# Patient Record
Sex: Female | Born: 1945 | Race: White | Hispanic: No | Marital: Married | Smoking: Former smoker
Health system: Southern US, Community
[De-identification: ages and names within clinical notes are randomized; demographics above are authoritative.]

## PROBLEM LIST (undated history)

## (undated) DIAGNOSIS — I10 Essential (primary) hypertension: Secondary | ICD-10-CM

## (undated) DIAGNOSIS — G459 Transient cerebral ischemic attack, unspecified: Secondary | ICD-10-CM

## (undated) DIAGNOSIS — C50919 Malignant neoplasm of unspecified site of unspecified female breast: Secondary | ICD-10-CM

## (undated) DIAGNOSIS — F419 Anxiety disorder, unspecified: Secondary | ICD-10-CM

## (undated) HISTORY — PX: AORTIC VALVE REPLACEMENT: SHX41

## (undated) HISTORY — PX: ROTATOR CUFF REPAIR: SHX139

## (undated) HISTORY — PX: APPENDECTOMY: SHX54

## (undated) HISTORY — PX: MASTECTOMY: SHX3

## (undated) HISTORY — DX: Transient cerebral ischemic attack, unspecified: G45.9

## (undated) HISTORY — DX: Anxiety disorder, unspecified: F41.9

## (undated) HISTORY — DX: Essential (primary) hypertension: I10

---

## 2007-01-16 ENCOUNTER — Encounter: Payer: Self-pay | Admitting: Orthopedic Surgery

## 2007-01-24 ENCOUNTER — Encounter: Payer: Self-pay | Admitting: Orthopedic Surgery

## 2007-02-23 ENCOUNTER — Encounter: Payer: Self-pay | Admitting: Orthopedic Surgery

## 2014-06-02 DIAGNOSIS — G8929 Other chronic pain: Secondary | ICD-10-CM | POA: Insufficient documentation

## 2014-06-02 DIAGNOSIS — M25559 Pain in unspecified hip: Secondary | ICD-10-CM

## 2014-11-26 ENCOUNTER — Other Ambulatory Visit (HOSPITAL_COMMUNITY): Payer: Self-pay | Admitting: Psychiatry

## 2014-11-26 DIAGNOSIS — R292 Abnormal reflex: Secondary | ICD-10-CM

## 2014-11-26 DIAGNOSIS — M542 Cervicalgia: Secondary | ICD-10-CM

## 2014-11-26 DIAGNOSIS — R202 Paresthesia of skin: Secondary | ICD-10-CM

## 2014-11-26 DIAGNOSIS — R200 Anesthesia of skin: Secondary | ICD-10-CM

## 2014-12-04 ENCOUNTER — Ambulatory Visit (HOSPITAL_COMMUNITY)
Admission: RE | Admit: 2014-12-04 | Discharge: 2014-12-04 | Disposition: A | Discharge status: Home / Self Care | Payer: Medicare Other | Source: Ambulatory Visit | Attending: Psychiatry | Admitting: Psychiatry

## 2014-12-04 DIAGNOSIS — M47892 Other spondylosis, cervical region: Secondary | ICD-10-CM | POA: Insufficient documentation

## 2014-12-04 DIAGNOSIS — R202 Paresthesia of skin: Secondary | ICD-10-CM

## 2014-12-04 DIAGNOSIS — R51 Headache: Secondary | ICD-10-CM | POA: Diagnosis not present

## 2014-12-04 DIAGNOSIS — M1288 Other specific arthropathies, not elsewhere classified, other specified site: Secondary | ICD-10-CM | POA: Insufficient documentation

## 2014-12-04 DIAGNOSIS — R200 Anesthesia of skin: Secondary | ICD-10-CM

## 2014-12-04 DIAGNOSIS — R292 Abnormal reflex: Secondary | ICD-10-CM

## 2014-12-04 DIAGNOSIS — M4802 Spinal stenosis, cervical region: Secondary | ICD-10-CM | POA: Insufficient documentation

## 2014-12-04 DIAGNOSIS — M542 Cervicalgia: Secondary | ICD-10-CM | POA: Diagnosis present

## 2015-03-10 ENCOUNTER — Other Ambulatory Visit (HOSPITAL_COMMUNITY): Payer: Self-pay | Admitting: Psychiatry

## 2015-03-10 ENCOUNTER — Ambulatory Visit (HOSPITAL_COMMUNITY)
Admission: RE | Admit: 2015-03-10 | Discharge: 2015-03-10 | Disposition: A | Discharge status: Home / Self Care | Payer: Medicare Other | Source: Ambulatory Visit | Attending: Psychiatry | Admitting: Psychiatry

## 2015-03-10 DIAGNOSIS — M5414 Radiculopathy, thoracic region: Secondary | ICD-10-CM | POA: Insufficient documentation

## 2015-03-10 DIAGNOSIS — M5410 Radiculopathy, site unspecified: Secondary | ICD-10-CM | POA: Insufficient documentation

## 2015-05-01 ENCOUNTER — Other Ambulatory Visit (HOSPITAL_COMMUNITY): Payer: Self-pay | Admitting: Psychiatry

## 2015-05-01 DIAGNOSIS — R269 Unspecified abnormalities of gait and mobility: Secondary | ICD-10-CM

## 2015-05-01 DIAGNOSIS — M546 Pain in thoracic spine: Secondary | ICD-10-CM

## 2015-05-12 ENCOUNTER — Ambulatory Visit (HOSPITAL_COMMUNITY)
Admission: RE | Admit: 2015-05-12 | Discharge: 2015-05-12 | Disposition: A | Discharge status: Home / Self Care | Payer: Medicare Other | Source: Ambulatory Visit | Attending: Psychiatry | Admitting: Psychiatry

## 2015-05-12 DIAGNOSIS — R918 Other nonspecific abnormal finding of lung field: Secondary | ICD-10-CM | POA: Insufficient documentation

## 2015-05-12 DIAGNOSIS — M545 Low back pain: Secondary | ICD-10-CM | POA: Diagnosis present

## 2015-05-12 DIAGNOSIS — Z853 Personal history of malignant neoplasm of breast: Secondary | ICD-10-CM | POA: Insufficient documentation

## 2015-05-12 DIAGNOSIS — R937 Abnormal findings on diagnostic imaging of other parts of musculoskeletal system: Secondary | ICD-10-CM | POA: Diagnosis not present

## 2015-05-12 DIAGNOSIS — M546 Pain in thoracic spine: Secondary | ICD-10-CM | POA: Diagnosis not present

## 2015-05-12 DIAGNOSIS — R269 Unspecified abnormalities of gait and mobility: Secondary | ICD-10-CM

## 2015-05-13 ENCOUNTER — Other Ambulatory Visit (HOSPITAL_COMMUNITY): Payer: Self-pay | Admitting: Psychiatry

## 2015-05-13 DIAGNOSIS — R222 Localized swelling, mass and lump, trunk: Secondary | ICD-10-CM

## 2015-05-19 ENCOUNTER — Ambulatory Visit (HOSPITAL_COMMUNITY)
Admission: RE | Admit: 2015-05-19 | Discharge: 2015-05-19 | Disposition: A | Discharge status: Home / Self Care | Payer: Medicare Other | Source: Ambulatory Visit | Attending: Psychiatry | Admitting: Psychiatry

## 2015-05-19 DIAGNOSIS — R222 Localized swelling, mass and lump, trunk: Secondary | ICD-10-CM | POA: Diagnosis present

## 2015-05-19 LAB — POCT I-STAT CREATININE: CREATININE: 0.7 mg/dL (ref 0.44–1.00)

## 2015-05-19 MED ORDER — IOHEXOL 300 MG/ML  SOLN
100.0000 mL | Freq: Once | INTRAMUSCULAR | Status: AC | PRN
  Administered 2015-05-19: 75 mL via INTRAVENOUS

## 2015-08-11 DIAGNOSIS — I350 Nonrheumatic aortic (valve) stenosis: Secondary | ICD-10-CM | POA: Insufficient documentation

## 2015-11-12 ENCOUNTER — Other Ambulatory Visit (HOSPITAL_COMMUNITY): Payer: Self-pay | Admitting: Family Medicine

## 2015-11-12 DIAGNOSIS — Z1231 Encounter for screening mammogram for malignant neoplasm of breast: Secondary | ICD-10-CM

## 2015-11-21 ENCOUNTER — Other Ambulatory Visit (HOSPITAL_COMMUNITY): Payer: Self-pay | Admitting: Family Medicine

## 2015-11-21 ENCOUNTER — Ambulatory Visit (HOSPITAL_COMMUNITY)
Admission: RE | Admit: 2015-11-21 | Discharge: 2015-11-21 | Disposition: A | Discharge status: Home / Self Care | Payer: Medicare Other | Source: Ambulatory Visit | Attending: Family Medicine | Admitting: Family Medicine

## 2015-11-21 DIAGNOSIS — Z1231 Encounter for screening mammogram for malignant neoplasm of breast: Secondary | ICD-10-CM | POA: Diagnosis not present

## 2015-12-17 ENCOUNTER — Ambulatory Visit (HOSPITAL_COMMUNITY)
Admission: RE | Admit: 2015-12-17 | Discharge: 2015-12-17 | Disposition: A | Discharge status: Home / Self Care | Payer: Medicare Other | Source: Ambulatory Visit | Attending: Psychiatry | Admitting: Psychiatry

## 2015-12-17 ENCOUNTER — Other Ambulatory Visit (HOSPITAL_COMMUNITY): Payer: Self-pay | Admitting: Psychiatry

## 2015-12-17 DIAGNOSIS — M545 Low back pain: Secondary | ICD-10-CM

## 2015-12-17 DIAGNOSIS — M549 Dorsalgia, unspecified: Secondary | ICD-10-CM | POA: Insufficient documentation

## 2016-07-21 ENCOUNTER — Other Ambulatory Visit
Admission: RE | Admit: 2016-07-21 | Discharge: 2016-07-21 | Disposition: A | Discharge status: Home / Self Care | Payer: Medicare Other | Source: Ambulatory Visit | Attending: Pain Medicine | Admitting: Pain Medicine

## 2016-07-21 ENCOUNTER — Ambulatory Visit
Admission: RE | Admit: 2016-07-21 | Discharge: 2016-07-21 | Disposition: A | Discharge status: Home / Self Care | Payer: Medicare Other | Source: Ambulatory Visit | Attending: Pain Medicine | Admitting: Pain Medicine

## 2016-07-21 ENCOUNTER — Ambulatory Visit: Payer: Medicare Other | Attending: Pain Medicine | Admitting: Pain Medicine

## 2016-07-21 ENCOUNTER — Encounter: Payer: Self-pay | Admitting: Pain Medicine

## 2016-07-21 VITALS — BP 156/77 | HR 101 | Temp 98.3°F | Resp 18 | Ht <= 58 in | Wt 159.0 lb

## 2016-07-21 DIAGNOSIS — I700 Atherosclerosis of aorta: Secondary | ICD-10-CM | POA: Insufficient documentation

## 2016-07-21 DIAGNOSIS — S32010S Wedge compression fracture of first lumbar vertebra, sequela: Secondary | ICD-10-CM

## 2016-07-21 DIAGNOSIS — G8929 Other chronic pain: Secondary | ICD-10-CM | POA: Insufficient documentation

## 2016-07-21 DIAGNOSIS — S22050A Wedge compression fracture of T5-T6 vertebra, initial encounter for closed fracture: Secondary | ICD-10-CM | POA: Insufficient documentation

## 2016-07-21 DIAGNOSIS — M542 Cervicalgia: Secondary | ICD-10-CM

## 2016-07-21 DIAGNOSIS — S32040S Wedge compression fracture of fourth lumbar vertebra, sequela: Secondary | ICD-10-CM

## 2016-07-21 DIAGNOSIS — M546 Pain in thoracic spine: Secondary | ICD-10-CM

## 2016-07-21 DIAGNOSIS — Z8781 Personal history of (healed) traumatic fracture: Secondary | ICD-10-CM | POA: Insufficient documentation

## 2016-07-21 DIAGNOSIS — M545 Low back pain, unspecified: Secondary | ICD-10-CM | POA: Insufficient documentation

## 2016-07-21 DIAGNOSIS — S3992XA Unspecified injury of lower back, initial encounter: Secondary | ICD-10-CM | POA: Diagnosis not present

## 2016-07-21 DIAGNOSIS — M47814 Spondylosis without myelopathy or radiculopathy, thoracic region: Secondary | ICD-10-CM

## 2016-07-21 DIAGNOSIS — S32050A Wedge compression fracture of fifth lumbar vertebra, initial encounter for closed fracture: Secondary | ICD-10-CM | POA: Insufficient documentation

## 2016-07-21 DIAGNOSIS — M818 Other osteoporosis without current pathological fracture: Secondary | ICD-10-CM | POA: Diagnosis not present

## 2016-07-21 DIAGNOSIS — M5414 Radiculopathy, thoracic region: Secondary | ICD-10-CM

## 2016-07-21 DIAGNOSIS — M810 Age-related osteoporosis without current pathological fracture: Secondary | ICD-10-CM

## 2016-07-21 DIAGNOSIS — M47894 Other spondylosis, thoracic region: Secondary | ICD-10-CM | POA: Insufficient documentation

## 2016-07-21 DIAGNOSIS — F1190 Opioid use, unspecified, uncomplicated: Secondary | ICD-10-CM | POA: Diagnosis not present

## 2016-07-21 DIAGNOSIS — S22000S Wedge compression fracture of unspecified thoracic vertebra, sequela: Secondary | ICD-10-CM

## 2016-07-21 DIAGNOSIS — M4854XA Collapsed vertebra, not elsewhere classified, thoracic region, initial encounter for fracture: Secondary | ICD-10-CM | POA: Insufficient documentation

## 2016-07-21 DIAGNOSIS — S32040A Wedge compression fracture of fourth lumbar vertebra, initial encounter for closed fracture: Secondary | ICD-10-CM | POA: Insufficient documentation

## 2016-07-21 DIAGNOSIS — S22000A Wedge compression fracture of unspecified thoracic vertebra, initial encounter for closed fracture: Secondary | ICD-10-CM | POA: Insufficient documentation

## 2016-07-21 DIAGNOSIS — Z952 Presence of prosthetic heart valve: Secondary | ICD-10-CM | POA: Insufficient documentation

## 2016-07-21 DIAGNOSIS — M47896 Other spondylosis, lumbar region: Secondary | ICD-10-CM

## 2016-07-21 DIAGNOSIS — Z79891 Long term (current) use of opiate analgesic: Secondary | ICD-10-CM | POA: Insufficient documentation

## 2016-07-21 DIAGNOSIS — S22050S Wedge compression fracture of T5-T6 vertebra, sequela: Secondary | ICD-10-CM

## 2016-07-21 DIAGNOSIS — M47816 Spondylosis without myelopathy or radiculopathy, lumbar region: Secondary | ICD-10-CM | POA: Insufficient documentation

## 2016-07-21 DIAGNOSIS — Z0189 Encounter for other specified special examinations: Secondary | ICD-10-CM | POA: Insufficient documentation

## 2016-07-21 DIAGNOSIS — Z79899 Other long term (current) drug therapy: Secondary | ICD-10-CM | POA: Diagnosis not present

## 2016-07-21 DIAGNOSIS — M79604 Pain in right leg: Secondary | ICD-10-CM | POA: Diagnosis not present

## 2016-07-21 DIAGNOSIS — S32050S Wedge compression fracture of fifth lumbar vertebra, sequela: Secondary | ICD-10-CM

## 2016-07-21 DIAGNOSIS — M47812 Spondylosis without myelopathy or radiculopathy, cervical region: Secondary | ICD-10-CM | POA: Insufficient documentation

## 2016-07-21 DIAGNOSIS — Z5181 Encounter for therapeutic drug level monitoring: Secondary | ICD-10-CM

## 2016-07-21 DIAGNOSIS — M4806 Spinal stenosis, lumbar region: Secondary | ICD-10-CM | POA: Diagnosis not present

## 2016-07-21 DIAGNOSIS — M4724 Other spondylosis with radiculopathy, thoracic region: Secondary | ICD-10-CM | POA: Diagnosis not present

## 2016-07-21 DIAGNOSIS — S32010A Wedge compression fracture of first lumbar vertebra, initial encounter for closed fracture: Secondary | ICD-10-CM | POA: Insufficient documentation

## 2016-07-21 DIAGNOSIS — M4802 Spinal stenosis, cervical region: Secondary | ICD-10-CM

## 2016-07-21 DIAGNOSIS — M549 Dorsalgia, unspecified: Secondary | ICD-10-CM

## 2016-07-21 LAB — SEDIMENTATION RATE: Sed Rate: 55 mm/hr — ABNORMAL HIGH (ref 0–30)

## 2016-07-21 LAB — COMPREHENSIVE METABOLIC PANEL
ALK PHOS: 81 U/L (ref 38–126)
ALT: 22 U/L (ref 14–54)
AST: 23 U/L (ref 15–41)
Albumin: 3.9 g/dL (ref 3.5–5.0)
Anion gap: 7 (ref 5–15)
BILIRUBIN TOTAL: 0.9 mg/dL (ref 0.3–1.2)
BUN: 12 mg/dL (ref 6–20)
CALCIUM: 9 mg/dL (ref 8.9–10.3)
CHLORIDE: 99 mmol/L — AB (ref 101–111)
CO2: 26 mmol/L (ref 22–32)
CREATININE: 0.75 mg/dL (ref 0.44–1.00)
GFR calc non Af Amer: >60 mL/min (ref >60)
Glucose, Bld: 103 mg/dL — ABNORMAL HIGH (ref 65–99)
Potassium: 4.1 mmol/L (ref 3.5–5.1)
Sodium: 132 mmol/L — ABNORMAL LOW (ref 135–145)
TOTAL PROTEIN: 7.6 g/dL (ref 6.5–8.1)

## 2016-07-21 LAB — MAGNESIUM: Magnesium: 1.8 mg/dL (ref 1.7–2.4)

## 2016-07-21 LAB — C-REACTIVE PROTEIN: CRP: 2.6 mg/dL — AB (ref <1.0)

## 2016-07-21 LAB — VITAMIN B12: VITAMIN B 12: 1329 pg/mL — AB (ref 180–914)

## 2016-07-21 MED ORDER — KETOROLAC TROMETHAMINE 60 MG/2ML IM SOLN
INTRAMUSCULAR | Status: AC
  Administered 2016-07-21: 16:00:00 via INTRAMUSCULAR
  Filled 2016-07-21: qty 2

## 2016-07-21 MED ORDER — KETOROLAC TROMETHAMINE 60 MG/2ML IM SOLN: 60.0000 mg | Freq: Once | INTRAMUSCULAR | Status: AC

## 2016-07-21 MED ORDER — ORPHENADRINE CITRATE 30 MG/ML IJ SOLN: 60.0000 mg | Freq: Once | INTRAMUSCULAR | Status: AC

## 2016-07-21 MED ORDER — ORPHENADRINE CITRATE 30 MG/ML IJ SOLN
INTRAMUSCULAR | Status: AC
  Administered 2016-07-21: 60 mg via INTRAMUSCULAR
  Filled 2016-07-21: qty 2

## 2016-07-21 NOTE — Progress Notes (Signed)
Safety precautions to be maintained throughout the outpatient stay will include: orient to surroundings, keep bed in low position, maintain call bell within reach at all times, provide assistance with transfer out of bed and ambulation.  

## 2016-07-21 NOTE — Patient Instructions (Signed)
Epidural Steroid Injection Patient Information  Description: The epidural space surrounds the nerves as they exit the spinal cord.  In some patients, the nerves can be compressed and inflamed by a bulging disc or a tight spinal canal (spinal stenosis).  By injecting steroids into the epidural space, we can bring irritated nerves into direct contact with a potentially helpful medication.  These steroids act directly on the irritated nerves and can reduce swelling and inflammation which often leads to decreased pain.  Epidural steroids may be injected anywhere along the spine and from the neck to the low back depending upon the location of your pain.   After numbing the skin with local anesthetic (like Novocaine), a small needle is passed into the epidural space slowly.  You may experience a sensation of pressure while this is being done.  The entire block usually last less than 10 minutes.  Conditions which may be treated by epidural steroids:   Low back and leg pain  Neck and arm pain  Spinal stenosis  Post-laminectomy syndrome  Herpes zoster (shingles) pain  Pain from compression fractures  Preparation for the injection:  1. Do not eat any solid food or dairy products within 8 hours of your appointment.  2. You may drink clear liquids up to 3 hours before appointment.  Clear liquids include water, black coffee, juice or soda.  No milk or cream please. 3. You may take your regular medication, including pain medications, with a sip of water before your appointment  Diabetics should hold regular insulin (if taken separately) and take 1/2 normal NPH dos the morning of the procedure.  Carry some sugar containing items with you to your appointment. 4. A driver must accompany you and be prepared to drive you home after your procedure.  5. Bring all your current medications with your. 6. An IV may be inserted and sedation may be given at the discretion of the physician.   7. A blood pressure  cuff, EKG and other monitors will often be applied during the procedure.  Some patients may need to have extra oxygen administered for a short period. 8. You will be asked to provide medical information, including your allergies, prior to the procedure.  We must know immediately if you are taking blood thinners (like Coumadin/Warfarin)  Or if you are allergic to IV iodine contrast (dye). We must know if you could possible be pregnant.  Possible side-effects:  Bleeding from needle site  Infection (rare, may require surgery)  Nerve injury (rare)  Numbness & tingling (temporary)  Difficulty urinating (rare, temporary)  Spinal headache ( a headache worse with upright posture)  Light -headedness (temporary)  Pain at injection site (several days)  Decreased blood pressure (temporary)  Weakness in arm/leg (temporary)  Pressure sensation in back/neck (temporary)  Call if you experience:  Fever/chills associated with headache or increased back/neck pain.  Headache worsened by an upright position.  New onset weakness or numbness of an extremity below the injection site  Hives or difficulty breathing (go to the emergency room)  Inflammation or drainage at the infection site  Severe back/neck pain  Any new symptoms which are concerning to you  Please note:  Although the local anesthetic injected can often make your back or neck feel good for several hours after the injection, the pain will likely return.  It takes 3-7 days for steroids to work in the epidural space.  You may not notice any pain relief for at least that one week.    If effective, we will often do a series of three injections spaced 3-6 weeks apart to maximally decrease your pain.  After the initial series, we generally will wait several months before considering a repeat injection of the same type.  If you have any questions, please call [phone] [place] your labs and  x-rays done as soon as possible.

## 2016-07-21 NOTE — Progress Notes (Signed)
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 strength & Tone: Functionally intact  Muscle strength & Tone: Functionally intact  Sensory: Unimpaired  Sensory: Unimpaired  Palpation: Non-contributory  Palpation: Non-contributory    Assessment  Primary Diagnosis & Pertinent Problem List: The primary encounter diagnosis was Chronic pain. Diagnoses of Long term current use of opiate analgesic, Long term prescription opiate use, Opiate use, Encounter for therapeutic drug level monitoring, Encounter for pain management planning, Chronic neck pain (Left), Chronic upper back pain (Bilateral), Chronic low back pain (Bilateral) (R>L), Chronic lower extremity pain (Right), History of compression fracture of vertebral column (50% L1, L4), Lumbar facet hypertrophy, Lumbar facet syndrome, Osteoporosis, Compression fracture of thoracic vertebra, sequela, Compression fracture of L1 lumbar vertebra, sequela, Compression fracture of L4 lumbar vertebra, sequela, Compression  fracture of L5 lumbar vertebra, sequela, Closed wedge compression fracture of T6 vertebra, sequela, Cervical foraminal stenosis, Cervical spondylosis, Thoracic spondylosis, Lumbar spondylosis, unspecified spinal osteoarthritis, Acute low back pain, and Thoracic radiculopathy (Location of Primary Source of Pain) (Bilateral) (T6) were also pertinent to this visit.  Visit Diagnosis: 1. Chronic pain   2. Long term current use of opiate analgesic   3. Long term prescription opiate use   4. Opiate use   5. Encounter for therapeutic drug level monitoring   6. Encounter for pain management planning   7. Chronic neck pain (Left)   8. Chronic upper back pain (Bilateral)   9. Chronic low back pain (Bilateral) (R>L)   10. Chronic lower extremity pain (Right)   11. History of compression fracture of vertebral column (50% L1, L4)   12. Lumbar facet hypertrophy   13. Lumbar facet syndrome   14. Osteoporosis   15. Compression fracture of thoracic vertebra, sequela   16. Compression fracture of L1 lumbar vertebra, sequela   17. Compression fracture of L4 lumbar vertebra, sequela   18. Compression fracture of L5 lumbar vertebra, sequela   19. Closed wedge compression fracture of T6 vertebra, sequela   20. Cervical foraminal stenosis   21. Cervical spondylosis   22. Thoracic spondylosis   23. Lumbar spondylosis, unspecified spinal osteoarthritis   24. Acute low back pain   25. Thoracic radiculopathy (Location of Primary Source of Pain) (Bilateral) (T6)    Plan of Care  Initial Treatment Plan:  Please be advised that as  per protocol, today's visit has been an evaluation only. We have not taken over the patient's controlled substance management.  Problem-Specific Plan: No problem-specific Assessment & Plan notes found for this encounter.  Ordered Lab-work, Procedure(s), & Referral(s) : Orders Placed This Encounter  Procedures  . Thoracic Epidural Injection  . DG Thoracic Spine 2 View  .  Compliance Drug Analysis, Ur  . Comprehensive metabolic panel  . C-reactive protein  . Magnesium  . Sedimentation rate  . Vitamin B12  . 25-Hydroxyvitamin D Lcms D2+D3  . Ambulatory referral to Psychology  . Ambulatory referral to Neurosurgery  Referral(s) or Consult(s): Medical psychology consult for substance use disorder evaluation Pharmacotherapy: Medications ordered:  Meds ordered this encounter  Medications  . orphenadrine (NORFLEX) injection 60 mg  . ketorolac (TORADOL) injection 60 mg  . orphenadrine (NORFLEX) 30 MG/ML injection    Donneta Romberg, Dena: cabinet override  . ketorolac (TORADOL) 60 MG/2ML injection    Donneta Romberg, Dena: cabinet override   Prescriptions ordered during this visit: New Prescriptions   No medications on file   Medications administered during this visit: We administered orphenadrine and ketorolac.   Pharmacotherapy plan under consideration:  The patient is currently taking tramadol 50 mg 1 tablet by mouth 3 times a day. She clearly has indications for the use of controlled substances. We may consider going up on her dose to 2 tablets by mouth 4 times a day PRN.    Interventional Therapies: Interventional procedures under consideration:  Left T5-6 thoracic epidural steroid injection under fluoroscopic guidance and IV sedation. Referral to neurosurgery for possible T6 kyphoplasty with biopsy.    Requested PM Follow-up: Return for (ASAP) for Scheduled Procedure.  Future Appointments Date Time Provider [place]  07/21/2016 4:30 PM [facility]  07/27/2016 9:15 AM Milinda Pointer, MD [facility] None    Primary Care Physician: Petra Kuba, MD Location: [facility] Note by: Kathlen Brunswick. Dossie Arbour, M.D, DABA, DABAPM, DABPM, DABIPP, FIPP  Pain Score Disclaimer: We use the NRS-11 scale. This is a self-reported, subjective measurement of pain severity with only modest accuracy. It is used primarily to  identify changes within a particular patient. It must be understood that outpatient pain scales are significantly less accurate that those used for research, where they can be applied under ideal controlled circumstances with minimal exposure to variables. In reality, the score is likely to be a combination of pain intensity and pain affect, where pain affect describes the degree of emotional arousal or changes in action readiness caused by the sensory experience of pain. Factors such as social and work situation, setting, emotional state, anxiety levels, expectation, and prior pain experience may influence pain perception and show large inter-individual differences that may also be affected by time variables.  Patient instructions provided during this appointment: Patient Instructions  Epidural Steroid Injection Patient Information  Description: The epidural space surrounds the nerves as they exit the spinal cord.  In some patients, the nerves can be compressed and inflamed by a bulging disc or a tight spinal canal (spinal stenosis).  By injecting steroids into the epidural space, we can bring irritated nerves into direct contact with a potentially helpful medication.  These steroids act directly on the irritated nerves and can reduce swelling and inflammation which often leads to decreased pain.  Epidural steroids may be injected anywhere along the spine and from the neck to the low back depending upon the location of your pain.   After numbing the skin with local  anesthetic (like Novocaine), a small needle is passed into the epidural space slowly.  You may experience a sensation of pressure while this is being done.  The entire block usually last less than 10 minutes.  Conditions which may be treated by epidural steroids:   Low back and leg pain  Neck and arm pain  Spinal stenosis  Post-laminectomy syndrome  Herpes zoster (shingles) pain  Pain from compression fractures  Preparation for  the injection:  1. Do not eat any solid food or dairy products within 8 hours of your appointment.  2. You may drink clear liquids up to 3 hours before appointment.  Clear liquids include water, black coffee, juice or soda.  No milk or cream please. 3. You may take your regular medication, including pain medications, with a sip of water before your appointment  Diabetics should hold regular insulin (if taken separately) and take 1/2 normal NPH dos the morning of the procedure.  Carry some sugar containing items with you to your appointment. 4. A driver must accompany you and be prepared to drive you home after your procedure.  5. Bring all your current medications with your. 6. An IV may be inserted and sedation may be given at the discretion of the physician.   7. A blood pressure cuff, EKG and other monitors will often be applied during the procedure.  Some patients may need to have extra oxygen administered for a short period. 8. You will be asked to provide medical information, including your allergies, prior to the procedure.  We must know immediately if you are taking blood thinners (like Coumadin/Warfarin)  Or if you are allergic to IV iodine contrast (dye). We must know if you could possible be pregnant.  Possible side-effects:  Bleeding from needle site  Infection (rare, may require surgery)  Nerve injury (rare)  Numbness & tingling (temporary)  Difficulty urinating (rare, temporary)  Spinal headache ( a headache worse with upright posture)  Light -headedness (temporary)  Pain at injection site (several days)  Decreased blood pressure (temporary)  Weakness in arm/leg (temporary)  Pressure sensation in back/neck (temporary)  Call if you experience:  Fever/chills associated with headache or increased back/neck pain.  Headache worsened by an upright position.  New onset weakness or numbness of an extremity below the injection site  Hives or difficulty breathing (go to  the emergency room)  Inflammation or drainage at the infection site  Severe back/neck pain  Any new symptoms which are concerning to you  Please note:  Although the local anesthetic injected can often make your back or neck feel good for several hours after the injection, the pain will likely return.  It takes 3-7 days for steroids to work in the epidural space.  You may not notice any pain relief for at least that one week.  If effective, we will often do a series of three injections spaced 3-6 weeks apart to maximally decrease your pain.  After the initial series, we generally will wait several months before considering a repeat injection of the same type.  If you have any questions, please call [phone] [place] your labs and x-rays done as soon as possible.

## 2016-07-25 LAB — 25-HYDROXY VITAMIN D LCMS D2+D3
25-Hydroxy, Vitamin D-2: <1 ng/mL
25-Hydroxy, Vitamin D-3: 40 ng/mL

## 2016-07-25 LAB — 25-HYDROXYVITAMIN D LCMS D2+D3: 25-HYDROXY, VITAMIN D: 40 ng/mL

## 2016-07-27 ENCOUNTER — Ambulatory Visit (HOSPITAL_BASED_OUTPATIENT_CLINIC_OR_DEPARTMENT_OTHER): Payer: Medicare Other | Admitting: Pain Medicine

## 2016-07-27 ENCOUNTER — Encounter: Payer: Self-pay | Admitting: Pain Medicine

## 2016-07-27 ENCOUNTER — Ambulatory Visit
Admission: RE | Admit: 2016-07-27 | Discharge: 2016-07-27 | Disposition: A | Discharge status: Home / Self Care | Payer: Medicare Other | Source: Ambulatory Visit | Attending: Pain Medicine | Admitting: Pain Medicine

## 2016-07-27 VITALS — BP 138/80 | HR 71 | Temp 97.0°F | Resp 30 | Ht <= 58 in | Wt 160.0 lb

## 2016-07-27 DIAGNOSIS — S22050A Wedge compression fracture of T5-T6 vertebra, initial encounter for closed fracture: Secondary | ICD-10-CM | POA: Diagnosis not present

## 2016-07-27 DIAGNOSIS — M4724 Other spondylosis with radiculopathy, thoracic region: Secondary | ICD-10-CM | POA: Diagnosis not present

## 2016-07-27 DIAGNOSIS — G8929 Other chronic pain: Secondary | ICD-10-CM | POA: Insufficient documentation

## 2016-07-27 DIAGNOSIS — Z952 Presence of prosthetic heart valve: Secondary | ICD-10-CM | POA: Diagnosis not present

## 2016-07-27 DIAGNOSIS — I350 Nonrheumatic aortic (valve) stenosis: Secondary | ICD-10-CM | POA: Insufficient documentation

## 2016-07-27 DIAGNOSIS — S22000S Wedge compression fracture of unspecified thoracic vertebra, sequela: Secondary | ICD-10-CM | POA: Diagnosis not present

## 2016-07-27 DIAGNOSIS — M4856XA Collapsed vertebra, not elsewhere classified, lumbar region, initial encounter for fracture: Secondary | ICD-10-CM | POA: Diagnosis not present

## 2016-07-27 DIAGNOSIS — M545 Low back pain: Secondary | ICD-10-CM | POA: Insufficient documentation

## 2016-07-27 DIAGNOSIS — M5414 Radiculopathy, thoracic region: Secondary | ICD-10-CM | POA: Insufficient documentation

## 2016-07-27 DIAGNOSIS — M47894 Other spondylosis, thoracic region: Secondary | ICD-10-CM | POA: Diagnosis not present

## 2016-07-27 DIAGNOSIS — M542 Cervicalgia: Secondary | ICD-10-CM | POA: Insufficient documentation

## 2016-07-27 MED ORDER — ROPIVACAINE HCL 2 MG/ML IJ SOLN
2.0000 mL | Freq: Once | INTRAMUSCULAR | Status: AC
  Administered 2016-07-27: 2 mL via EPIDURAL

## 2016-07-27 MED ORDER — SODIUM CHLORIDE 0.9% FLUSH
2.0000 mL | Freq: Once | INTRAVENOUS | Status: AC
  Administered 2016-07-27: 2 mL

## 2016-07-27 MED ORDER — CEFAZOLIN SODIUM 1 G IJ SOLR
INTRAMUSCULAR | Status: AC
Start: 2016-07-27 — End: 2016-07-27
  Administered 2016-07-27: 1 g via INTRAVENOUS
  Filled 2016-07-27: qty 10

## 2016-07-27 MED ORDER — FENTANYL CITRATE (PF) 100 MCG/2ML IJ SOLN
INTRAMUSCULAR | Status: AC
  Administered 2016-07-27: 50 ug via INTRAVENOUS
  Filled 2016-07-27: qty 2

## 2016-07-27 MED ORDER — CEFAZOLIN (ANCEF) 1 G IV SOLR
1.0000 g | INTRAVENOUS | Status: AC
Start: 2016-07-27 — End: 2016-07-28
  Filled 2016-07-27: qty 1

## 2016-07-27 MED ORDER — MIDAZOLAM HCL 5 MG/5ML IJ SOLN
INTRAMUSCULAR | Status: AC
  Administered 2016-07-27: 2 mg via INTRAVENOUS
  Filled 2016-07-27: qty 5

## 2016-07-27 MED ORDER — SODIUM CHLORIDE 0.9 % IJ SOLN
INTRAMUSCULAR | Status: AC
  Filled 2016-07-27: qty 10

## 2016-07-27 MED ORDER — LACTATED RINGERS IV SOLN: 1000.0000 mL | Freq: Once | INTRAVENOUS | Status: AC

## 2016-07-27 MED ORDER — FENTANYL CITRATE (PF) 100 MCG/2ML IJ SOLN: 25.0000 ug | INTRAMUSCULAR | Status: AC | PRN

## 2016-07-27 MED ORDER — LIDOCAINE HCL (PF) 1 % IJ SOLN
INTRAMUSCULAR | Status: AC
  Administered 2016-07-27: 10:00:00
  Filled 2016-07-27: qty 5

## 2016-07-27 MED ORDER — LIDOCAINE HCL (PF) 1 % IJ SOLN
10.0000 mL | Freq: Once | INTRAMUSCULAR | Status: AC
  Administered 2016-07-27: 10 mL

## 2016-07-27 MED ORDER — IOPAMIDOL (ISOVUE-M 200) INJECTION 41%
INTRAMUSCULAR | Status: AC
  Administered 2016-07-27: 10:00:00
  Filled 2016-07-27: qty 10

## 2016-07-27 MED ORDER — DEXAMETHASONE SODIUM PHOSPHATE 10 MG/ML IJ SOLN
INTRAMUSCULAR | Status: AC
Start: 2016-07-27 — End: 2016-07-27
  Administered 2016-07-27: 10:00:00
  Filled 2016-07-27: qty 1

## 2016-07-27 MED ORDER — MIDAZOLAM HCL 5 MG/5ML IJ SOLN: 1.0000 mg | INTRAMUSCULAR | Status: AC | PRN

## 2016-07-27 MED ORDER — ROPIVACAINE HCL 2 MG/ML IJ SOLN
INTRAMUSCULAR | Status: AC
Start: 2016-07-27 — End: 2016-07-27
  Administered 2016-07-27: 10:00:00
  Filled 2016-07-27: qty 10

## 2016-07-27 MED ORDER — IOPAMIDOL (ISOVUE-M 200) INJECTION 41%: 10.0000 mL | Freq: Once | INTRAMUSCULAR | Status: AC

## 2016-07-27 MED ORDER — DEXAMETHASONE SODIUM PHOSPHATE 10 MG/ML IJ SOLN: 10.0000 mg | Freq: Once | INTRAMUSCULAR | Status: AC

## 2016-07-27 NOTE — Progress Notes (Signed)
Patient's Name: Beth Sloan  MRN: [id number]  Referring Provider: Petra Kuba, MD  DOB: 1946/05/11  PCP: Petra Kuba, MD  DOS: 07/27/2016  Note by: Kathlen Brunswick. Dossie Arbour, MD  Service setting: Ambulatory outpatient  Location: [facility] (AMB) Pain Management Facility  Visit type: Procedure  Specialty: Interventional Pain Management  Patient type: Established   Primary Reason for Visit: Interventional Pain Management Treatment. CC: Back Pain (upper and lower )  Procedure:  Anesthesia, Analgesia, Anxiolysis:  Type: Therapeutic Inter-Laminar Thoracic Epidural Block Region: Posterior Thoracolumbar Level: T5-6 Laterality: Left Paraspinal    Type: Moderate (Conscious) Sedation & Local Anesthesia Local Anesthetic: Lidocaine 1% Route: Intravenous (IV) IV Access: Secured Sedation: Meaningful verbal contact was maintained at all times during the procedure  Indication(s): Analgesia & Anxiolysis  Indications: 1. Thoracic radiculopathy (Location of Primary Source of Pain) (Bilateral) (T6)   2. Osteoarthritis of spine with radiculopathy, thoracic region   3. Closed compression fracture of thoracic vertebra, sequela   4. S/P aortic valve replacement    Pain Score: Pre-procedure: 9 /10 Post-procedure: 7 /10  Pre-Procedure Assessment:  Ms. Rowen is a 70 y.o. year old, female patient, seen today for interventional treatment. She has Chronic hip pain; S/P aortic valve replacement; Severe aortic stenosis; Chronic pain; Long term current use of opiate analgesic; Long term prescription opiate use; Opiate use; Encounter for therapeutic drug level monitoring; Encounter for pain management planning; Chronic neck pain (Left); Chronic upper back pain (Bilateral); Chronic low back pain (Bilateral) (R>L); Chronic lower extremity pain (Right); History of compression fracture of vertebral column (L1, L4) (50% L5); Lumbar facet hypertrophy; Lumbar facet syndrome; Osteoporosis; Compression fracture of  thoracic vertebra (HCC) (T6); Compression fracture of L1 lumbar vertebra ([place]); Compression fracture of L4 lumbar vertebra ([place]); Compression fracture of L5 lumbar vertebra ([place]); Closed wedge compression fracture of T6 vertebra ([place]); Cervical foraminal stenosis; Cervical spondylosis; Thoracic spondylosis; Lumbar spondylosis; Acute low back pain; and Thoracic radiculopathy (Location of Primary Source of Pain) (Bilateral) (T6) on her problem list.. Her primarily concern today is the Back Pain (upper and lower )   Pain Type: Chronic pain Pain Location: Back Pain Orientation: Upper, Lower Pain Descriptors / Indicators: Burning Pain Frequency: Constant  Date of Last Visit: 07/21/16 Service Provided on Last Visit: Evaluation (new pt)  Coagulation Parameters No results found for: INR, LABPROT, APTT, PLT  Verification of the correct person, correct site (including marking of site), and correct procedure were performed and confirmed by the patient.  Consent: Secured. Under the influence of no sedatives a written informed consent was obtained, after having provided information on the risks and possible complications. To fulfill our ethical and legal obligations, as recommended by the American Medical Association's Code of Ethics, we have provided information to the patient about our clinical impression; the nature and purpose of the treatment or procedure; the risks, benefits, and possible complications of the intervention; alternatives; the risk(s) and benefit(s) of the alternative treatment(s) or procedure(s); and the risk(s) and benefit(s) of doing nothing. The patient was provided information about the risks and possible complications associated with the procedure. These include, but are not limited to, failure to achieve desired goals, infection, bleeding, organ or nerve damage, allergic reactions, paralysis, and death. In the case of spinal procedures these may include, but are not limited to,  failure to achieve desired goals, infection, bleeding, organ or nerve damage, allergic reactions, paralysis, and death. In addition, the patient was informed that Medicine is not an exact science; therefore, there is also  the possibility of unforeseen risks and possible complications that may result in a catastrophic outcome. The patient indicated having understood very clearly. We have given the patient no guarantees and we have made no promises. Enough time was given to the patient to ask questions, all of which were answered to the patient's satisfaction.  Consent Attestation: I, the ordering provider, attest that I have discussed with the patient the benefits, risks, side-effects, alternatives, likelihood of achieving goals, and potential problems during recovery for the procedure that I have provided informed consent.  Pre-Procedure Preparation: Safety Precautions: Allergies reviewed. Appropriate site, procedure, and patient were confirmed by following the Joint Commission's Universal Protocol (UP.01.01.01), in the form of a "Time Out". The patient was asked to confirm marked site and procedure, before commencing. The patient was asked about blood thinners, or active infections, both of which were denied. Patient was assessed for positional comfort and all pressure points were checked before starting procedure. Allergies: She has no allergies on file.. Infection Control Precautions: Sterile technique used. Standard Universal Precautions were taken as recommended by the Department of [facility] for Disease Control and Prevention (CDC). Standard pre-surgical skin prep was conducted. Respiratory hygiene and cough etiquette was practiced. Hand hygiene observed. Safe injection practices and needle disposal techniques followed. SDV (single dose vial) medications used. Medications properly checked for expiration dates and contaminants. Personal protective equipment (PPE) used: Surgical mask. Sterile  Radiation-resistant gloves. Monitoring:  As per clinic protocol. Vitals:   07/27/16 1029 07/27/16 1036 07/27/16 1047 07/27/16 1056  BP: 131/71 (!) 146/84 132/75 138/80  Pulse: 77 76 72 71  Resp: 15 17 (!) 22 (!) 30  Temp:  97.7 F (36.5 C)  97 F (36.1 C)  TempSrc:  Temporal    SpO2: 94% 96% 95% 95%  Weight:      Height:      Calculated BMI: Body mass index is 35.87 kg/m.  Description of Procedure Process:  Time-out: "Time-out" completed before starting procedure, as per protocol. Position: Prone Target Area: For Epidural Steroid injection(s), the target area is the  interlaminar space, initially targeting the lower border of the superior vertebral body lamina. Approach: Interlaminar approach. Area Prepped: Entire Posterior Thoracolumbar Region Prepping solution: Duraprep (Iodine Povacrylex [0.7% available Iodine] and Isopropyl Alcohol, 74% w/w) Safety Precautions: Aspiration looking for blood return was conducted prior to all injections. At no point did we inject any substances, as a needle was being advanced. No attempts were made at seeking any paresthesias. Safe injection practices and needle disposal techniques used. Medications properly checked for expiration dates. SDV (single dose vial) medications used.   Description of the Procedure: Protocol guidelines were followed. The patient was placed in position over the fluoroscopy table. The target area was identified and the area prepped in the usual manner. Skin & deeper tissues infiltrated with local anesthetic. Appropriate amount of time allowed to pass for local anesthetics to take effect. The procedure needles were then advanced to the target area. The inferior aspect of the superior lamina was contacted and the needle walked caudad, until the lamina was cleared. The epidural space was identified using "loss-of-resistance technique" with 0.9% PF-NSS (2-30m), in a low friction 10cc LOR glass syringe. Proper needle placement was  secured. Negative aspiration confirmed. Solution injected in intermittent fashion, asking for systemic symptoms every 0.5 cc of injectate. The needles were then removed and the area cleansed, making sure to leave some of the prepping solution behind to take advantage of its long term bactericidal  properties. EBL: None Materials & Medications Used:  Needle(s) Used: 20g - 10cm, Tuohy-style epidural needle Medication(s): Please see chart orders for medication and dosing details.  Imaging Guidance:   Type of Imaging Technique: Fluoroscopy Guidance (Spinal) Indication(s): Assistance in needle guidance and placement for procedures requiring needle placement in or near specific anatomical locations not easily accessible without such assistance. Exposure Time: Please see nurses notes. Contrast: Before injecting any contrast, we confirmed that the patient did not have an allergy to iodine, shellfish, or radiological contrast. Once satisfactory needle placement was completed at the desired level, radiological contrast was injected. Injection was conducted under continuous fluoroscopic guidance. Injection of contrast accomplished without complications. See chart for type and volume of contrast used. Fluoroscopic Guidance: I was personally present in the fluoroscopy suite, where the patient was placed in position for the procedure, over the fluoroscopy-compatible table. Fluoroscopy was manipulated, using "Tunnel Vision Technique", to obtain the best possible view of the target area, on the affected side. Parallax error was corrected before commencing the procedure. A "direction-depth-direction" technique was used to introduce the needle under continuous pulsed fluoroscopic guidance. Once the target was reached, antero-posterior, oblique, and lateral fluoroscopic projection views were taken to confirm needle placement in all planes. Permanently recorded images stored by scanning into EMR. Interpretation:  Intraoperative imaging interpretation by performing Physician. Adequate needle placement confirmed in AP & Oblique Views. Appropriate spread of contrast to desired area. No evidence of afferent or efferent intravascular uptake. No intrathecal or subarachnoid spread observed. Permanent images scanned into the patient's record.  Antibiotic Prophylaxis:  Indication(s): No indications identified. Type:  Antibiotics Given (last 72 hours)    Date/Time Action Medication Dose   07/27/16 1020 Given   ceFAZolin (ANCEF) 1 g injection 1 g       Post-operative Assessment:   Complications: No immediate post-treatment complications were observed. Disposition: Return to clinic for follow-up evaluation. The patient tolerated the entire procedure well. A repeat set of vitals were taken after the procedure and the patient was kept under observation following institutional policy, for this type of procedure. Post-procedural neurological assessment was performed, showing return to baseline, prior to discharge. The patient was discharged home, once institutional criteria were met. The patient was provided with post-procedure discharge instructions, including a section on how to identify potential problems. Should any problems arise concerning this procedure, the patient was given instructions to immediately contact us, at any time, without hesitation. In any case, we plan to contact the patient by telephone for a follow-up status report regarding this interventional procedure. Comments:  No additional relevant information.  Plan of Care   Problem List Items Addressed This Visit      High   Compression fracture of thoracic vertebra (HCC) (T6) (Chronic)   Thoracic radiculopathy (Location of Primary Source of Pain) (Bilateral) (T6) - Primary   Relevant Medications   fentaNYL (SUBLIMAZE) injection 25-50 mcg   lactated ringers infusion 1,000 mL   midazolam (VERSED) 5 MG/5ML injection 1-2 mg   dexamethasone  (DECADRON) injection 10 mg   iopamidol (ISOVUE-M) 41 % intrathecal injection 10 mL   lidocaine (PF) (XYLOCAINE) 1 % injection 10 mL (Completed)   sodium chloride flush (NS) 0.9 % injection 2 mL (Completed)   ropivacaine (PF) 2 mg/ml (0.2%) (NAROPIN) epidural 2 mL (Completed)   ceFAZolin (ANCEF) powder 1 g   midazolam (VERSED) 5 MG/5ML injection (Completed)   Other Relevant Orders   Thoracic Epidural Injection   DG C-Arm 1-60 Min-No Report (Completed)  Informed Consent Details: Transcribe to consent form and obtain patient signature   Thoracic spondylosis (Chronic)   Relevant Medications   fentaNYL (SUBLIMAZE) injection 25-50 mcg   dexamethasone (DECADRON) injection 10 mg   dexamethasone (DECADRON) 10 MG/ML injection (Completed)   fentaNYL (SUBLIMAZE) 100 MCG/2ML injection (Completed)     Low   S/P aortic valve replacement   Relevant Medications   ceFAZolin (ANCEF) powder 1 g    Other Visit Diagnoses   None.     Requested PM Follow-up: Return in about 2 weeks (around 08/10/2016) for Post-Procedure evaluation.  Future Appointments Date Time Provider [place]  08/26/2016 8:20 AM Milinda Pointer, MD [facility] None    Primary Care Physician: Petra Kuba, MD Location: [facility] Outpatient Pain Management Facility Note by: Kathlen Brunswick. Dossie Arbour, M.D, DABA, DABAPM, DABPM, DABIPP, FIPP  Disclaimer:  Medicine is not an exact science. The only guarantee in medicine is that nothing is guaranteed. It is important to note that the decision to proceed with this intervention was based on the information collected from the patient. The Data and conclusions were drawn from the patient's questionnaire, the interview, and the physical examination. Because the information was provided in large part by the patient, it cannot be guaranteed that it has not been purposely or unconsciously manipulated. Every effort has been made to obtain as much relevant data as possible for this  evaluation. It is important to note that the conclusions that lead to this procedure are derived in large part from the available data. Always take into account that the treatment will also be dependent on availability of resources and existing treatment guidelines, considered by other Pain Management Practitioners as being common knowledge and practice, at the time of the intervention. For Medico-Legal purposes, it is also important to point out that variation in procedural techniques and pharmacological choices are the acceptable norm. The indications, contraindications, technique, and results of the above procedure should only be interpreted and judged by a Board-Certified Interventional Pain Specialist with extensive familiarity and expertise in the same exact procedure and technique. Attempts at providing opinions without similar or greater experience and expertise than that of the treating physician will be considered as inappropriate and unethical, and shall result in a formal complaint to the state medical board and applicable specialty societies.

## 2016-07-27 NOTE — Patient Instructions (Signed)

## 2016-07-27 NOTE — Progress Notes (Signed)
Safety precautions to be maintained throughout the outpatient stay will include: orient to surroundings, keep bed in low position, maintain call bell within reach at all times, provide assistance with transfer out of bed and ambulation.  

## 2016-07-28 ENCOUNTER — Telehealth: Payer: Self-pay | Admitting: *Deleted

## 2016-07-28 LAB — COMPLIANCE DRUG ANALYSIS, UR

## 2016-07-28 NOTE — Telephone Encounter (Signed)
Patient denies complications post procedure. 
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
## 2016-08-22 NOTE — Progress Notes (Signed)
Normal Vitamin B-12 level(s): are between 180 and 914 pg/mL.  Elevated Vit B-12 level(s): Levels above 914 pg/mL. Possible causes: Taking supplements of vitamin B-12 (cobalamin). Medical conditions that can increase levels of vitamin B12 include: liver disease, kidney failure and myeloproliferative disorders, which includes myelocytic leukemia and polycythemia vera. Recommendations: Stop vitamin B-12 supplements. Contact primary care physician for further evaluation and recommendations. 

## 2016-08-22 NOTE — Progress Notes (Signed)
-  A normal sedimentation rate should be below 30 mm/hr. The sed rate is an acute phase reactant that indirectly measures the degree of inflammation present in the body. It can be acute, developing rapidly after trauma, injury or infection, for example, or can occur over an extended time (chronic) with conditions such as autoimmune diseases or cancer. The ESR is not diagnostic; it is a non-specific, screening test that may be elevated in a number of these different conditions. It provides general information about the presence or absence of an inflammatory condition.   Normal CRP (C-reactive protein) Level(s): Less than <1.0 mg/dL. Elevated level(s): Levels above 1.0 mg/dL. Clinical significance: C-reactive protein (CRP) is produced by the liver. The level of CRP rises when there is inflammation throughout the body. CRP goes up in response to inflammation. High levels suggests the presence of chronic inflammation but do not identify its location or cause. Drops of previously elevated levels suggest that the inflammation or infection is subsiding and/or responding to treatment. Possible causes: High levels have been observed in obese patients, individuals with bacterial infections, chronic inflammation, or flare-ups of inflammatory conditions. Recommendations: - Consider the use of anti-inflammatory diet and medications. - The combined elevation of the ESR & CRP, may be suggestive of an autoimmune disease. Patients with elevated levels of both should consider an evaluation by a Rheumatologist.

## 2016-08-22 NOTE — Progress Notes (Signed)
-   Low levels of sodium in blood is known as "Hyponatremia". When severe, symptoms can include: headaches; confusion or altered mental state; seizures; decreased consciousness which can proceed to coma and death. Less severe symptoms include: restlessness; muscle spasms or cramps; weakness, and tiredness. Causes may include: chronic conditions such as kidney failure (when excess fluid cannot be efficiently excreted) and congestive heart failure, in which excess fluid accumulates in the body. SIADH (syndrome of inappropriate anti-diuretic hormone) is a disease whereby the body produces too much anti-diuretic hormone (ADH), resulting in retention of water in the body. These are considered to be dilutional causes. In addition, it can also result when sodium is lost from the body as is the case during prolonged sweating and severe vomiting or diarrhoea. Medical conditions that can sometimes be associated with hyponatraemia include adrenal insufficiency, hypothyroidism, and cirrhosis of the liver. The eating disorder anorexia can also cause a sodium imbalance. Some drugs can lower blood sodium levels. Examples of these include diuretics (water tablets), desmopressin, and sulfonylureas. - Normal chloride levels are between 95 and 107 mEq/L. Low levels may be due to: Addison disease; Bartter syndrome; burns; congestive heart failure; dehydration; excessive sweating; hyperaldosteronism; metabolic alkalosis; respiratory acidosis (compensated); Syndrome of inappropriate diuretic hormone secretion (SIADH); or vomiting. - Normal fasting (NPO x 8 hours) glucose levels are between 65-99 mg/dl, with 2 hour fasting, levels are usually less than 140 mg/dl. Any random blood glucose level greater than 200 mg/dl is considered to be Diabetes.

## 2016-08-22 NOTE — Progress Notes (Signed)
Results were reviewed and found to be: significantly abnormal  Surgical consultation is recommended  Review would suggest interventional pain management techniques may be of benefit 

## 2016-08-26 ENCOUNTER — Ambulatory Visit: Payer: Medicare Other | Admitting: Pain Medicine

## 2017-03-16 ENCOUNTER — Other Ambulatory Visit (HOSPITAL_COMMUNITY): Payer: Self-pay | Admitting: Family Medicine

## 2017-03-16 DIAGNOSIS — Z1231 Encounter for screening mammogram for malignant neoplasm of breast: Secondary | ICD-10-CM

## 2017-03-23 ENCOUNTER — Ambulatory Visit (HOSPITAL_COMMUNITY)
Admission: RE | Admit: 2017-03-23 | Discharge: 2017-03-23 | Disposition: A | Discharge status: Home / Self Care | Payer: Medicare Other | Source: Ambulatory Visit | Attending: Family Medicine | Admitting: Family Medicine

## 2017-03-23 DIAGNOSIS — Z1231 Encounter for screening mammogram for malignant neoplasm of breast: Secondary | ICD-10-CM | POA: Diagnosis present

## 2017-03-23 DIAGNOSIS — Z9012 Acquired absence of left breast and nipple: Secondary | ICD-10-CM | POA: Diagnosis not present

## 2018-02-21 ENCOUNTER — Other Ambulatory Visit (HOSPITAL_COMMUNITY): Payer: Self-pay | Admitting: Family Medicine

## 2018-02-21 DIAGNOSIS — Z1231 Encounter for screening mammogram for malignant neoplasm of breast: Secondary | ICD-10-CM

## 2018-03-27 ENCOUNTER — Ambulatory Visit (HOSPITAL_COMMUNITY)
Admission: RE | Admit: 2018-03-27 | Discharge: 2018-03-27 | Disposition: A | Discharge status: Home / Self Care | Payer: Medicare Other | Source: Ambulatory Visit | Attending: Family Medicine | Admitting: Family Medicine

## 2018-03-27 ENCOUNTER — Encounter (HOSPITAL_COMMUNITY): Payer: Self-pay

## 2018-03-27 ENCOUNTER — Other Ambulatory Visit (HOSPITAL_COMMUNITY): Payer: Self-pay | Admitting: Family Medicine

## 2018-03-27 DIAGNOSIS — Z1231 Encounter for screening mammogram for malignant neoplasm of breast: Secondary | ICD-10-CM | POA: Diagnosis not present

## 2018-03-27 HISTORY — DX: Malignant neoplasm of unspecified site of unspecified female breast: C50.919

## 2019-04-04 ENCOUNTER — Other Ambulatory Visit (HOSPITAL_COMMUNITY): Payer: Self-pay | Admitting: Family Medicine

## 2019-04-04 DIAGNOSIS — Z1231 Encounter for screening mammogram for malignant neoplasm of breast: Secondary | ICD-10-CM

## 2019-04-20 ENCOUNTER — Ambulatory Visit (HOSPITAL_COMMUNITY)
Admission: RE | Admit: 2019-04-20 | Discharge: 2019-04-20 | Disposition: A | Discharge status: Home / Self Care | Payer: Medicare Other | Source: Ambulatory Visit | Attending: Family Medicine | Admitting: Family Medicine

## 2019-04-20 ENCOUNTER — Other Ambulatory Visit: Payer: Self-pay

## 2019-04-20 ENCOUNTER — Other Ambulatory Visit (HOSPITAL_COMMUNITY): Payer: Self-pay | Admitting: Family Medicine

## 2019-04-20 ENCOUNTER — Encounter (HOSPITAL_COMMUNITY): Payer: Self-pay

## 2019-04-20 DIAGNOSIS — Z1231 Encounter for screening mammogram for malignant neoplasm of breast: Secondary | ICD-10-CM

## 2020-03-28 ENCOUNTER — Other Ambulatory Visit (HOSPITAL_COMMUNITY): Payer: Self-pay | Admitting: Family Medicine

## 2020-03-28 DIAGNOSIS — Z1231 Encounter for screening mammogram for malignant neoplasm of breast: Secondary | ICD-10-CM

## 2020-04-23 ENCOUNTER — Other Ambulatory Visit (HOSPITAL_COMMUNITY): Payer: Self-pay | Admitting: Family Medicine

## 2020-04-23 ENCOUNTER — Ambulatory Visit (HOSPITAL_COMMUNITY)
Admission: RE | Admit: 2020-04-23 | Discharge: 2020-04-23 | Disposition: A | Discharge status: Home / Self Care | Payer: Medicare Other | Source: Ambulatory Visit | Attending: Family Medicine | Admitting: Family Medicine

## 2020-04-23 ENCOUNTER — Other Ambulatory Visit: Payer: Self-pay

## 2020-04-23 DIAGNOSIS — Z1231 Encounter for screening mammogram for malignant neoplasm of breast: Secondary | ICD-10-CM | POA: Diagnosis not present

## 2020-11-12 ENCOUNTER — Other Ambulatory Visit (HOSPITAL_COMMUNITY): Payer: Self-pay | Admitting: Family Medicine

## 2020-11-12 DIAGNOSIS — M810 Age-related osteoporosis without current pathological fracture: Secondary | ICD-10-CM

## 2020-11-24 ENCOUNTER — Ambulatory Visit (HOSPITAL_COMMUNITY)
Admission: RE | Admit: 2020-11-24 | Discharge: 2020-11-24 | Disposition: A | Discharge status: Home / Self Care | Payer: Medicare Other | Source: Ambulatory Visit | Attending: Family Medicine | Admitting: Family Medicine

## 2020-11-24 ENCOUNTER — Other Ambulatory Visit: Payer: Self-pay

## 2020-11-24 DIAGNOSIS — M810 Age-related osteoporosis without current pathological fracture: Secondary | ICD-10-CM | POA: Diagnosis present

## 2021-03-13 ENCOUNTER — Other Ambulatory Visit (HOSPITAL_COMMUNITY): Payer: Self-pay | Admitting: Family Medicine

## 2021-03-13 DIAGNOSIS — Z1231 Encounter for screening mammogram for malignant neoplasm of breast: Secondary | ICD-10-CM

## 2021-04-30 ENCOUNTER — Other Ambulatory Visit: Payer: Self-pay

## 2021-04-30 ENCOUNTER — Ambulatory Visit (HOSPITAL_COMMUNITY)
Admission: RE | Admit: 2021-04-30 | Discharge: 2021-04-30 | Disposition: A | Discharge status: Home / Self Care | Payer: Medicare Other | Source: Ambulatory Visit | Attending: Family Medicine | Admitting: Family Medicine

## 2021-04-30 DIAGNOSIS — Z1231 Encounter for screening mammogram for malignant neoplasm of breast: Secondary | ICD-10-CM | POA: Diagnosis not present

## 2023-06-23 ENCOUNTER — Encounter (HOSPITAL_COMMUNITY)
Admission: RE | Admit: 2023-06-23 | Discharge: 2023-06-23 | Disposition: A | Discharge status: Home / Self Care | Payer: Medicare HMO | Source: Ambulatory Visit | Attending: Internal Medicine | Admitting: Internal Medicine

## 2023-06-23 ENCOUNTER — Encounter (HOSPITAL_COMMUNITY): Payer: Self-pay

## 2023-06-23 VITALS — BP 92/50 | HR 72 | Ht <= 58 in | Wt 146.4 lb

## 2023-06-23 DIAGNOSIS — Z952 Presence of prosthetic heart valve: Secondary | ICD-10-CM | POA: Diagnosis not present

## 2023-06-23 NOTE — Patient Instructions (Signed)
Patient Instructions  Patient Details  Name: Beth Sloan MRN: [id number] Date of Birth: 09/12/1946 Referring Provider:  Clyda Greener, MD  Below are your personal goals for exercise, nutrition, and risk factors. Our goal is to help you stay on track towards obtaining and maintaining these goals. We will be discussing your progress on these goals with you throughout the program.  Initial Exercise Prescription:  Initial Exercise Prescription - 06/23/23 1100       Date of Initial Exercise RX and Referring Provider   Date 06/23/23    Referring Provider [place]      Oxygen   Maintain Oxygen Saturation 88% or higher      NuStep   Level 1    SPM 50    Minutes 30    METs 1      Prescription Details   Frequency (times per week) 3    Duration Progress to 30 minutes of continuous aerobic without signs/symptoms of physical distress      Intensity   THRR 40-80% of Max Heartrate 101-131    Ratings of Perceived Exertion 11-13    Perceived Dyspnea 0-4      Resistance Training   Training Prescription Yes    Weight 2    Reps 10-15             Exercise Goals: Frequency: Be able to perform aerobic exercise two to three times per week in program working toward 2-5 days per week of home exercise.  Intensity: Work with a perceived exertion of 11 (fairly light) - 15 (hard) while following your exercise prescription.  We will make changes to your prescription with you as you progress through the program.   Duration: Be able to do 30 to 45 minutes of continuous aerobic exercise in addition to a 5 minute warm-up and a 5 minute cool-down routine.   Nutrition Goals: Your personal nutrition goals will be established when you do your nutrition analysis with the dietician.  The following are general nutrition guidelines to follow: Cholesterol < 200mg /day Sodium < 1500mg /day Fiber: Women over 50 yrs - 21 grams per day  Personal Goals:  Personal Goals and Risk Factors at Admission -  06/23/23 1010       Core Components/Risk Factors/Patient Goals on Admission    Weight Management Weight Maintenance    Improve shortness of breath with ADL's Yes    Intervention Provide education, individualized exercise plan and daily activity instruction to help decrease symptoms of SOB with activities of daily living.    Expected Outcomes Short Term: Improve cardiorespiratory fitness to achieve a reduction of symptoms when performing ADLs;Long Term: Be able to perform more ADLs without symptoms or delay the onset of symptoms    Heart Failure Yes    Intervention Provide a combined exercise and nutrition program that is supplemented with education, support and counseling about heart failure. Directed toward relieving symptoms such as shortness of breath, decreased exercise tolerance, and extremity edema.    Expected Outcomes Short term: Attendance in program 2-3 days a week with increased exercise capacity. Reported lower sodium intake. Reported increased fruit and vegetable intake. Reports medication compliance.;Short term: Daily weights obtained and reported for increase. Utilizing diuretic protocols set by physician.;Long term: Adoption of self-care skills and reduction of barriers for early signs and symptoms recognition and intervention leading to self-care maintenance.    Hypertension Yes    Intervention Provide education on lifestyle modifcations including regular physical activity/exercise, weight management, moderate sodium restriction and increased  consumption of fresh fruit, vegetables, and low fat dairy, alcohol moderation, and smoking cessation.;Monitor prescription use compliance.    Expected Outcomes Short Term: Continued assessment and intervention until BP is < 140/60mm HG in hypertensive participants. < 130/3mm HG in hypertensive participants with diabetes, heart failure or chronic kidney disease.;Long Term: Maintenance of blood pressure at goal levels.    Lipids Yes     Intervention Provide education and support for participant on nutrition & aerobic/resistive exercise along with prescribed medications to achieve LDL 70mg , HDL >40mg .    Expected Outcomes Short Term: Participant states understanding of desired cholesterol values and is compliant with medications prescribed. Participant is following exercise prescription and nutrition guidelines.;Long Term: Cholesterol controlled with medications as prescribed, with individualized exercise RX and with personalized nutrition plan. Value goals: LDL < 70mg , HDL > 40 mg.             Tobacco Use Initial Evaluation: Social History   Tobacco Use  Smoking Status Former   Current packs/day: 0.00   Types: Cigarettes   Quit date: 06/26/2015   Years since quitting: 7.9  Smokeless Tobacco Never    Exercise Goals and Review:  Exercise Goals     Row Name 06/23/23 1138             Exercise Goals   Increase Physical Activity Yes       Intervention Provide advice, education, support and counseling about physical activity/exercise needs.;Develop an individualized exercise prescription for aerobic and resistive training based on initial evaluation findings, risk stratification, comorbidities and participant's personal goals.       Expected Outcomes Short Term: Attend rehab on a regular basis to increase amount of physical activity.;Long Term: Exercising regularly at least 3-5 days a week.;Long Term: Add in home exercise to make exercise part of routine and to increase amount of physical activity.       Increase Strength and Stamina Yes       Intervention Provide advice, education, support and counseling about physical activity/exercise needs.;Develop an individualized exercise prescription for aerobic and resistive training based on initial evaluation findings, risk stratification, comorbidities and participant's personal goals.       Expected Outcomes Short Term: Increase workloads from initial exercise prescription  for resistance, speed, and METs.;Long Term: Improve cardiorespiratory fitness, muscular endurance and strength as measured by increased METs and functional capacity ( );Short Term: Perform resistance training exercises routinely during rehab and add in resistance training at home       Able to understand and use rate of perceived exertion (RPE) scale Yes       Intervention Provide education and explanation on how to use RPE scale       Expected Outcomes Short Term: Able to use RPE daily in rehab to express subjective intensity level;Long Term:  Able to use RPE to guide intensity level when exercising independently       Knowledge and understanding of Target Heart Rate Range (THRR) Yes       Intervention Provide education and explanation of THRR including how the numbers were predicted and where they are located for reference       Expected Outcomes Short Term: Able to state/look up THRR;Long Term: Able to use THRR to govern intensity when exercising independently;Short Term: Able to use daily as guideline for intensity in rehab       Able to check pulse independently Yes       Intervention Review the importance of being able to check your own pulse  for safety during independent exercise;Provide education and demonstration on how to check pulse in carotid and radial arteries.       Expected Outcomes Short Term: Able to explain why pulse checking is important during independent exercise;Long Term: Able to check pulse independently and accurately       Understanding of Exercise Prescription Yes       Intervention Provide education, explanation, and written materials on patient's individual exercise prescription       Expected Outcomes Short Term: Able to explain program exercise prescription;Long Term: Able to explain home exercise prescription to exercise independently                Copy of goals given to participant.

## 2023-06-23 NOTE — Progress Notes (Signed)
Cardiac Individual Treatment Plan  Patient Details  Name: Beth Sloan MRN: [id number] Date of Birth: December 10, 1945 Referring Provider:   Flowsheet Row CARDIAC REHAB PHASE II ORIENTATION from 06/23/2023 in [place] [facility]  Referring Provider [place]       Initial Encounter Date:  Flowsheet Row CARDIAC REHAB PHASE II ORIENTATION from 06/23/2023 in [place] [facility]  Date 06/23/23       Visit Diagnosis: S/P TAVR (transcatheter aortic valve replacement)  Patient's Home Medications on Admission:  Current Outpatient Medications:    albuterol (VENTOLIN HFA) 108 (90 Base) MCG/ACT inhaler, Inhale 1 puff into the lungs every 6 (six) hours as needed for wheezing or shortness of breath., Disp: , Rfl:    apixaban (ELIQUIS) 5 MG TABS tablet, Take 5 mg by mouth 2 (two) times daily., Disp: , Rfl:    Ascorbic Acid (VITAMIN C) 1000 MG tablet, Take 1,000 mg by mouth., Disp: , Rfl:    atorvastatin (LIPITOR) 80 MG tablet, Take 40 mg by mouth. , Disp: , Rfl:    buPROPion (WELLBUTRIN SR) 150 MG 12 hr tablet, Take 150 mg by mouth daily., Disp: , Rfl:    calcium carbonate (OSCAL) 1500 (600 Ca) MG TABS tablet, Take 600 mg of elemental calcium by mouth daily with breakfast., Disp: , Rfl:    calcium carbonate (TUMS - DOSED IN MG ELEMENTAL CALCIUM) 500 MG chewable tablet, Chew 1 tablet by mouth daily., Disp: , Rfl:    Cyanocobalamin (RA VITAMIN B-12 TR) 1000 MCG TBCR, Take by mouth., Disp: , Rfl:    diclofenac Sodium (VOLTAREN) 1 % GEL, Apply 4 g topically 4 (four) times daily. As needed, Disp: , Rfl:    empagliflozin (JARDIANCE) 10 MG TABS tablet, Take 10 mg by mouth daily., Disp: , Rfl:    fluticasone (FLONASE) 50 MCG/ACT nasal spray, Place 1 spray into both nostrils daily. PRN, Disp: , Rfl:    furosemide (LASIX) 40 MG tablet, Take 40 mg by mouth daily. PRN for swelling, Disp: , Rfl:    gabapentin (NEURONTIN) 300 MG capsule, Take 300 mg by mouth., Disp: , Rfl:    losartan  (COZAAR) 50 MG tablet, Take 50 mg by mouth daily., Disp: , Rfl:    Melatonin 3 MG CAPS, Take 3 mg by mouth at bedtime. AT bedtime, Disp: , Rfl:    metoprolol tartrate (LOPRESSOR) 25 MG tablet, Take 25 mg by mouth 2 (two) times daily. , Disp: , Rfl:    Multiple Vitamin (MULTIVITAMIN) capsule, Take 1 capsule by mouth daily., Disp: , Rfl:    pantoprazole (PROTONIX) 40 MG tablet, Take 40 mg by mouth., Disp: , Rfl:    senna (SENOKOT) 8.6 MG TABS tablet, Take 1 tablet by mouth daily., Disp: , Rfl:    umeclidinium-vilanterol (ANORO ELLIPTA) 62.5-25 MCG/ACT AEPB, Inhale 1 puff into the lungs daily., Disp: , Rfl:    aspirin EC 81 MG tablet, Take 81 mg by mouth. (Patient not taking: Reported on 06/23/2023), Disp: , Rfl:    tizanidine (ZANAFLEX) 2 MG capsule, Take 2 mg by mouth at bedtime. Takes 1/2 tablet at bedtime, Disp: , Rfl:    traMADol (ULTRAM) 50 MG tablet, Take 50 mg by mouth. (Patient not taking: Reported on 06/23/2023), Disp: , Rfl:   Current Facility-Administered Medications:    dexamethasone (DECADRON) injection 10 mg, 10 mg, Other, Once, Delano Metz, MD   fentaNYL (SUBLIMAZE) injection 25-50 mcg, 25-50 mcg, Intravenous, Q5 min PRN, Delano Metz, MD   iopamidol (ISOVUE-M) 41 %  intrathecal injection 10 mL, 10 mL, Epidural, Once, Delano Metz, MD   lactated ringers infusion 1,000 mL, 1,000 mL, Intravenous, Once, Delano Metz, MD   midazolam (VERSED) 5 MG/5ML injection 1-2 mg, 1-2 mg, Intravenous, Q5 min PRN, Delano Metz, MD   orphenadrine (NORFLEX) injection 60 mg, 60 mg, Intramuscular, Once, Delano Metz, MD  Past Medical History: Past Medical History:  Diagnosis Date   Anxiety    Breast cancer (HCC)    Hypertension    TIA (transient ischemic attack)     Tobacco Use: Social History   Tobacco Use  Smoking Status Former   Current packs/day: 0.00   Types: Cigarettes   Quit date: 06/26/2015   Years since quitting: 7.9  Smokeless Tobacco Never     Labs: Review Flowsheet        No data to display          Capillary Blood Glucose: No results found for: "GLUCAP"   Exercise Target Goals: Exercise Program Goal: Individual exercise prescription set using results from initial 6 min walk test and THRR while considering  patient's activity barriers and safety.   Exercise Prescription Goal: Starting with aerobic activity 30 plus minutes a day, 3 days per week for initial exercise prescription. Provide home exercise prescription and guidelines that participant acknowledges understanding prior to discharge.  Activity Barriers & Risk Stratification:  Activity Barriers & Cardiac Risk Stratification - 06/23/23 0836       Activity Barriers & Cardiac Risk Stratification   Activity Barriers Back Problems;Shortness of Breath;Balance Concerns;History of Falls;Assistive Device    Cardiac Risk Stratification Moderate             6 Minute Walk:  6 Minute Walk     Row Name 06/23/23 1135         6 Minute Walk   Phase Initial     Distance 350 feet     Walk Time 6 minutes     # of Rest Breaks 1     MPH 0.66     METS 0.37     RPE 12     Perceived Dyspnea  1     VO2 Peak 1.31     Symptoms Yes (comment)     Comments needed a seated break for 1 min 20 sec; used rollator while walking     Resting HR 72 bpm     Resting BP 92/50     Resting Oxygen Saturation  92 %     Exercise Oxygen Saturation  during 6 min walk 90 %     Max Ex. HR 85 bpm     Max Ex. BP 122/50     2 Minute Post BP 108/50              Oxygen Initial Assessment:   Oxygen Re-Evaluation:   Oxygen Discharge (Final Oxygen Re-Evaluation):   Initial Exercise Prescription:  Initial Exercise Prescription - 06/23/23 1100       Date of Initial Exercise RX and Referring Provider   Date 06/23/23    Referring Provider [place]      Oxygen   Maintain Oxygen Saturation 88% or higher      NuStep   Level 1    SPM 50    Minutes 30    METs 1       Prescription Details   Frequency (times per week) 3    Duration Progress to 30 minutes of continuous aerobic without signs/symptoms of physical distress  Intensity   THRR 40-80% of Max Heartrate 101-131    Ratings of Perceived Exertion 11-13    Perceived Dyspnea 0-4      Resistance Training   Training Prescription Yes    Weight 2    Reps 10-15             Perform Capillary Blood Glucose checks as needed.  Exercise Prescription Changes:   Exercise Comments:   Exercise Goals and Review:   Exercise Goals     Row Name 06/23/23 1138             Exercise Goals   Increase Physical Activity Yes       Intervention Provide advice, education, support and counseling about physical activity/exercise needs.;Develop an individualized exercise prescription for aerobic and resistive training based on initial evaluation findings, risk stratification, comorbidities and participant's personal goals.       Expected Outcomes Short Term: Attend rehab on a regular basis to increase amount of physical activity.;Long Term: Exercising regularly at least 3-5 days a week.;Long Term: Add in home exercise to make exercise part of routine and to increase amount of physical activity.       Increase Strength and Stamina Yes       Intervention Provide advice, education, support and counseling about physical activity/exercise needs.;Develop an individualized exercise prescription for aerobic and resistive training based on initial evaluation findings, risk stratification, comorbidities and participant's personal goals.       Expected Outcomes Short Term: Increase workloads from initial exercise prescription for resistance, speed, and METs.;Long Term: Improve cardiorespiratory fitness, muscular endurance and strength as measured by increased METs and functional capacity ( );Short Term: Perform resistance training exercises routinely during rehab and add in resistance training at home       Able to  understand and use rate of perceived exertion (RPE) scale Yes       Intervention Provide education and explanation on how to use RPE scale       Expected Outcomes Short Term: Able to use RPE daily in rehab to express subjective intensity level;Long Term:  Able to use RPE to guide intensity level when exercising independently       Knowledge and understanding of Target Heart Rate Range (THRR) Yes       Intervention Provide education and explanation of THRR including how the numbers were predicted and where they are located for reference       Expected Outcomes Short Term: Able to state/look up THRR;Long Term: Able to use THRR to govern intensity when exercising independently;Short Term: Able to use daily as guideline for intensity in rehab       Able to check pulse independently Yes       Intervention Review the importance of being able to check your own pulse for safety during independent exercise;Provide education and demonstration on how to check pulse in carotid and radial arteries.       Expected Outcomes Short Term: Able to explain why pulse checking is important during independent exercise;Long Term: Able to check pulse independently and accurately       Understanding of Exercise Prescription Yes       Intervention Provide education, explanation, and written materials on patient's individual exercise prescription       Expected Outcomes Short Term: Able to explain program exercise prescription;Long Term: Able to explain home exercise prescription to exercise independently                Exercise Goals Re-Evaluation :  Discharge Exercise Prescription (Final Exercise Prescription Changes):   Nutrition:  Target Goals: Understanding of nutrition guidelines, daily intake of sodium 1500mg , cholesterol 200mg , calories 30% from fat and 7% or less from saturated fats, daily to have 5 or more servings of fruits and vegetables.  Biometrics:  Pre Biometrics - 06/23/23 1139       Pre  Biometrics   Height 4\' 9"  (1.448 m)    Weight 66.4 kg    Waist Circumference 43 inches    Hip Circumference 42 inches    Waist to Hip Ratio 1.02 %    BMI (Calculated) 31.67    Grip Strength 13.6 kg              Nutrition Therapy Plan and Nutrition Goals:   Nutrition Assessments:  MEDIFICTS Score Key: ?70 Need to make dietary changes  40-70 Heart Healthy Diet ? 40 Therapeutic Level Cholesterol Diet   Picture Your Plate Scores: <84 Unhealthy dietary pattern with much room for improvement. 41-50 Dietary pattern unlikely to meet recommendations for good health and room for improvement. 51-60 More healthful dietary pattern, with some room for improvement.  >60 Healthy dietary pattern, although there may be some specific behaviors that could be improved.    Nutrition Goals Re-Evaluation:   Nutrition Goals Discharge (Final Nutrition Goals Re-Evaluation):   Psychosocial: Target Goals: Acknowledge presence or absence of significant depression and/or stress, maximize coping skills, provide positive support system. Participant is able to verbalize types and ability to use techniques and skills needed for reducing stress and depression.  Initial Review & Psychosocial Screening:  Initial Psych Review & Screening - 06/23/23 1038       Initial Review   Current issues with Current Psychotropic Meds;None Identified      Family Dynamics   Good Support System? Yes   Her 2 daughter and 2 sisters.     Barriers   Psychosocial barriers to participate in program The patient should benefit from training in stress management and relaxation.;Psychosocial barriers identified (see note)      Screening Interventions   Interventions Encouraged to exercise;To provide support and resources with identified psychosocial needs;Provide feedback about the scores to participant    Expected Outcomes Short Term goal: Utilizing psychosocial counselor, staff and physician to assist with identification  of specific Stressors or current issues interfering with healing process. Setting desired goal for each stressor or current issue identified.;Long Term Goal: Stressors or current issues are controlled or eliminated.;Short Term goal: Identification and review with participant of any Quality of Life or Depression concerns found by scoring the questionnaire.;Long Term goal: The participant improves quality of Life and PHQ9 Scores as seen by post scores and/or verbalization of changes             Quality of Life Scores:  Scores of 19 and below usually indicate a poorer quality of life in these areas.  A difference of  2-3 points is a clinically meaningful difference.  A difference of 2-3 points in the total score of the Quality of Life Index has been associated with significant improvement in overall quality of life, self-image, physical symptoms, and general health in studies assessing change in quality of life.  PHQ-9: Review Flowsheet       06/23/2023 07/27/2016 07/21/2016  Depression screen PHQ 2/9  Decreased Interest 1 0 0  Down, Depressed, Hopeless 0 0 0  PHQ - 2 Score 1 0 0  Altered sleeping 1 - -  Tired, decreased energy 1 - -  Change in appetite 2 - -  Feeling bad or failure about yourself  0 - -  Trouble concentrating 0 - -  Moving slowly or fidgety/restless 0 - -  Suicidal thoughts 0 - -  PHQ-9 Score 5 - -  Difficult doing work/chores Not difficult at all - -    Details           Interpretation of Total Score  Total Score Depression Severity:  1-4 = Minimal depression, 5-9 = Mild depression, 10-14 = Moderate depression, 15-19 = Moderately severe depression, 20-27 = Severe depression   Psychosocial Evaluation and Intervention:  Psychosocial Evaluation - 06/23/23 1038       Psychosocial Evaluation & Interventions   Interventions Stress management education;Relaxation education;Encouraged to exercise with the program and follow exercise prescription    Comments Patient  was referred to CR with TAVR at [facility]. She is accompained by her daughter today. Her PHQ-9 score was 5 due to lack of energy, not eating a lot and sleeping too much. She says she has no trouble sleeping during the night but she thinks she sleeps too much during the day because her medication make her drowsy. She says she takes on average 2 naps during the day. She takes melotonin at bedtime and Zanaflex which help her sleep. She denies any depression, anxiety or stressors in her life. She quit smoking in 2016 when she had her aortic valve replaced and used Wellbutrin to help. She remained on the medication and says she continues to take it to help her stay calm. She had to have her aortic valve replaced again 05/02/23 and she was discharged to an inpatient rehab facility. 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 She now uses a rolling walker but hopes to be able to ambulate without any assitive device after completing CR. She does live alone but her daughter stays with her during the week to be closer to her job and she has 2 sisters that stay with her over the weekend. She does not drive. Her daughter says she is going to try and work with [facility] transportation or a family member will bring her. She has no barriers to participate in the program. Her main goals for the program are to get stronger, have more energy and be able to walk without her walker.    Expected Outcomes Short Term: start the program and attend consistently. Long Term: get stronger; have more energy; walk without her walker.    Continue Psychosocial Services  Follow up required by staff             Psychosocial Re-Evaluation:   Psychosocial Discharge (Final Psychosocial Re-Evaluation):   Vocational Rehabilitation: Provide vocational rehab assistance to qualifying candidates.   Vocational Rehab Evaluation & Intervention:  Vocational Rehab - 06/23/23 1010       Initial Vocational Rehab Evaluation &  Intervention   Assessment shows need for Vocational Rehabilitation No      Vocational Rehab Re-Evaulation   Comments Retired.             Education: Education Goals: Education classes will be provided on a weekly basis, covering required topics. Participant will state understanding/return demonstration of topics presented.  Learning Barriers/Preferences:  Learning Barriers/Preferences - 06/23/23 1014       Learning Barriers/Preferences   Learning Barriers None    Learning Preferences Written Material;Audio;Skilled Demonstration             Education Topics: Hypertension, Hypertension Reduction -Define  heart disease and high blood pressure. Discus how high blood pressure affects the body and ways to reduce high blood pressure.   Exercise and Your Heart -Discuss why it is important to exercise, the FITT principles of exercise, normal and abnormal responses to exercise, and how to exercise safely.   Angina -Discuss definition of angina, causes of angina, treatment of angina, and how to decrease risk of having angina.   Cardiac Medications -Review what the following cardiac medications are used for, how they affect the body, and side effects that may occur when taking the medications.  Medications include Aspirin, Beta blockers, calcium channel blockers, ACE Inhibitors, angiotensin receptor blockers, diuretics, digoxin, and antihyperlipidemics.   Congestive Heart Failure -Discuss the definition of CHF, how to live with CHF, the signs and symptoms of CHF, and how keep track of weight and sodium intake.   Heart Disease and Intimacy -Discus the effect sexual activity has on the heart, how changes occur during intimacy as we age, and safety during sexual activity.   Smoking Cessation / COPD -Discuss different methods to quit smoking, the health benefits of quitting smoking, and the definition of COPD.   Nutrition I: Fats -Discuss the types of cholesterol, what  cholesterol does to the heart, and how cholesterol levels can be controlled.   Nutrition II: Labels -Discuss the different components of food labels and how to read food label   Heart Parts/Heart Disease and PAD -Discuss the anatomy of the heart, the pathway of blood circulation through the heart, and these are affected by heart disease.   Stress I: Signs and Symptoms -Discuss the causes of stress, how stress may lead to anxiety and depression, and ways to limit stress.   Stress II: Relaxation -Discuss different types of relaxation techniques to limit stress.   Warning Signs of Stroke / TIA -Discuss definition of a stroke, what the signs and symptoms are of a stroke, and how to identify when someone is having stroke.   Knowledge Questionnaire Score:   Core Components/Risk Factors/Patient Goals at Admission:  Personal Goals and Risk Factors at Admission - 06/23/23 1010       Core Components/Risk Factors/Patient Goals on Admission    Weight Management Weight Maintenance    Improve shortness of breath with ADL's Yes    Intervention Provide education, individualized exercise plan and daily activity instruction to help decrease symptoms of SOB with activities of daily living.    Expected Outcomes Short Term: Improve cardiorespiratory fitness to achieve a reduction of symptoms when performing ADLs;Long Term: Be able to perform more ADLs without symptoms or delay the onset of symptoms    Heart Failure Yes    Intervention Provide a combined exercise and nutrition program that is supplemented with education, support and counseling about heart failure. Directed toward relieving symptoms such as shortness of breath, decreased exercise tolerance, and extremity edema.    Expected Outcomes Short term: Attendance in program 2-3 days a week with increased exercise capacity. Reported lower sodium intake. Reported increased fruit and vegetable intake. Reports medication compliance.;Short term:  Daily weights obtained and reported for increase. Utilizing diuretic protocols set by physician.;Long term: Adoption of self-care skills and reduction of barriers for early signs and symptoms recognition and intervention leading to self-care maintenance.    Hypertension Yes    Intervention Provide education on lifestyle modifcations including regular physical activity/exercise, weight management, moderate sodium restriction and increased consumption of fresh fruit, vegetables, and low fat dairy, alcohol moderation, and smoking cessation.;Monitor prescription use  compliance.    Expected Outcomes Short Term: Continued assessment and intervention until BP is < 140/12mm HG in hypertensive participants. < 130/69mm HG in hypertensive participants with diabetes, heart failure or chronic kidney disease.;Long Term: Maintenance of blood pressure at goal levels.    Lipids Yes    Intervention Provide education and support for participant on nutrition & aerobic/resistive exercise along with prescribed medications to achieve LDL 70mg , HDL >40mg .    Expected Outcomes Short Term: Participant states understanding of desired cholesterol values and is compliant with medications prescribed. Participant is following exercise prescription and nutrition guidelines.;Long Term: Cholesterol controlled with medications as prescribed, with individualized exercise RX and with personalized nutrition plan. Value goals: LDL < 70mg , HDL > 40 mg.             Core Components/Risk Factors/Patient Goals Review:    Core Components/Risk Factors/Patient Goals at Discharge (Final Review):    ITP Comments:   Comments: Patient arrived for 1st visit/orientation/education at 0800. Patient was referred to CR by Dr. Elyn Peers due to S/P TAVR. During orientation advised patient on arrival and appointment times what to wear, what to do before, during and after exercise. Reviewed attendance and class policy.  Pt is scheduled to return  Cardiac Rehab on 06/29/23 at 1100. Pt was advised to come to class 15 minutes before class starts.  Discussed RPE/Dpysnea scales. Patient participated in warm up stretches. Patient was able to complete 6 minute walk test.  Telemetry:NSR. Patient was measured for the equipment. Discussed equipment safety with patient. Took patient pre-anthropometric measurements. Patient finished visit at 1000. [name]

## 2023-06-29 ENCOUNTER — Encounter (HOSPITAL_COMMUNITY)
Admission: RE | Admit: 2023-06-29 | Discharge: 2023-06-29 | Disposition: A | Discharge status: Home / Self Care | Payer: Medicare HMO | Source: Ambulatory Visit | Attending: Internal Medicine | Admitting: Internal Medicine

## 2023-06-29 DIAGNOSIS — Z952 Presence of prosthetic heart valve: Secondary | ICD-10-CM | POA: Insufficient documentation

## 2023-06-29 LAB — GLUCOSE, CAPILLARY
Glucose-Capillary: 82 mg/dL (ref 70–99)
Glucose-Capillary: 81 mg/dL (ref 70–99)

## 2023-06-29 NOTE — Progress Notes (Signed)
Daily Session Note  Patient Details  Name: Beth Sloan MRN: [id number] Date of Birth: 07-31-1946 Referring Provider:   Flowsheet Row CARDIAC REHAB PHASE II ORIENTATION from 06/23/2023 in [facility] C[facility] REHABILIT[facility]  Referring Provider [place]       Encounter Date: 06/29/2023  Check In:  Session Check In - 06/29/23 1151       Check-In   Supervising physician immediately available to respond to emergencies See telemetry face sheet for immediately available MD    Location [facility]    Staff Present [name], BS, Exercise Physiologist;Rogelio Waynick [place], MA, RCEP, CCRP, Dow Adolph, RN, BSN    Virtual Visit No    Medication changes reported     No    Fall or balance concerns reported    No    Warm-up and Cool-down Performed on first and last piece of equipment    Resistance Training Performed Yes    VAD Patient? No    PAD/SET Patient? No      Pain Assessment   Currently in Pain? No/denies             Capillary Blood Glucose: Results for orders placed or performed during the hospital encounter of 06/29/23 (from the past 24 hour(s))  Glucose, capillary     Status: None   Collection Time: 06/29/23 11:26 AM  Result Value Ref Range   Glucose-Capillary 82 70 - 99 mg/dL      Social History   Tobacco Use  Smoking Status Former   Current packs/day: 0.00   Types: Cigarettes   Quit date: 06/26/2015   Years since quitting: 8.0  Smokeless Tobacco Never    Goals Met:  Exercise tolerated well Personal goals reviewed No report of concerns or symptoms today Strength training completed today  Goals Unmet:  Not Applicable  Comments: First full day of exercise!  Patient was oriented to gym and equipment including functions, settings, policies, and procedures.  Patient's individual exercise prescription and treatment plan were reviewed.  All starting workloads were established based on the results of the 6 minute walk test done at initial  orientation visit.  The plan for exercise progression was also introduced and progression will be customized based on patient's performance and goals.    Dr. Dina Rich is Medical Director for [facility] Cardiac Rehab

## 2023-07-01 ENCOUNTER — Encounter (HOSPITAL_COMMUNITY)
Admission: RE | Admit: 2023-07-01 | Discharge: 2023-07-01 | Disposition: A | Discharge status: Home / Self Care | Payer: Medicare HMO | Source: Ambulatory Visit | Attending: Internal Medicine

## 2023-07-01 DIAGNOSIS — Z952 Presence of prosthetic heart valve: Secondary | ICD-10-CM | POA: Diagnosis not present

## 2023-07-01 NOTE — Progress Notes (Signed)
Daily Session Note  Patient Details  Name: Beth Sloan MRN: [id number] Date of Birth: [birth year] Referring Provider:   Flowsheet Row CARDIAC REHAB PHASE II ORIENTATION from 06/23/2023 in [facility] CARDIAC REHABILITATION  Referring Provider [place]       Encounter Date: 07/01/2023  Check In:  Session Check In - 07/01/23 1045       Check-In   Supervising physician immediately available to respond to emergencies See telemetry face sheet for immediately available ER MD    Location [facility]    Staff Present Fabio Pierce, MA, RCEP, CCRP, Dow Adolph, RN, Pleas Koch, RN, BSN    Virtual Visit No    Medication changes reported     No    Fall or balance concerns reported    Yes    Comments Patient has history of falls; uses rolling walker and reports losing her balance at times.    Warm-up and Cool-down Performed on first and last piece of equipment    Resistance Training Performed Yes    VAD Patient? No    PAD/SET Patient? No      Pain Assessment   Currently in Pain? No/denies    Multiple Pain Sites No             Capillary Blood Glucose: No results found for this or any previous visit (from the past 24 hour(s)).    Social History   Tobacco Use  Smoking Status Former   Current packs/day: 0.00   Types: Cigarettes   Quit date: 06/26/2015   Years since quitting: 8.0  Smokeless Tobacco Never    Goals Met:  Independence with exercise equipment Exercise tolerated well No report of concerns or symptoms today Strength training completed today  Goals Unmet:  Not Applicable  Comments: Pt able to follow exercise prescription today without complaint.  Will continue to monitor for progression.    Dr. Dina Rich is Medical Director for [facility] Card[facility]

## 2023-07-04 ENCOUNTER — Encounter (HOSPITAL_COMMUNITY)
Admission: RE | Admit: 2023-07-04 | Discharge: 2023-07-04 | Disposition: A | Discharge status: Home / Self Care | Payer: Medicare HMO | Source: Ambulatory Visit | Attending: Internal Medicine | Admitting: Internal Medicine

## 2023-07-04 DIAGNOSIS — Z952 Presence of prosthetic heart valve: Secondary | ICD-10-CM | POA: Diagnosis not present

## 2023-07-04 NOTE — Progress Notes (Signed)
Daily Session Note  Patient Details  Name: Beth Sloan MRN: [id number] Date of Birth: [birth year] Referring Provider:   Flowsheet Row CARDIAC REHAB PHASE II ORIENTATION from 06/23/2023 in [facility] CARDIAC REHABILITATION  Referring Provider [place]       Encounter Date: 07/04/2023  Check In:  Session Check In - 07/04/23 1121       Check-In   Supervising physician immediately available to respond to emergencies See telemetry face sheet for immediately available MD    Staff Present [name], BS, Exercise Physiologist;Cruze Zingaro, RN;Jessica [place], MA, RCEP, CCRP, Dow Adolph, RN, BSN    Virtual Visit No    Medication changes reported     No    Fall or balance concerns reported    Yes    Comments Patient has history of falls; uses rolling walker and reports losing her balance at times.    Tobacco Cessation No Change    Warm-up and Cool-down Performed on first and last piece of equipment    Resistance Training Performed Yes    VAD Patient? No    PAD/SET Patient? No      Pain Assessment   Currently in Pain? No/denies    Multiple Pain Sites No             Capillary Blood Glucose: No results found for this or any previous visit (from the past 24 hour(s)).    Social History   Tobacco Use  Smoking Status Former   Current packs/day: 0.00   Types: Cigarettes   Quit date: 06/26/2015   Years since quitting: 8.0  Smokeless Tobacco Never    Goals Met:  Independence with exercise equipment Exercise tolerated well No report of concerns or symptoms today  Goals Unmet:  Not Applicable  Comments: Pt able to follow exercise prescription today without complaint.  Will continue to monitor for progression.    Dr. Dina Rich is Medical Director for [facility] Card[facility] Rehab

## 2023-07-06 ENCOUNTER — Encounter (HOSPITAL_COMMUNITY)
Admission: RE | Admit: 2023-07-06 | Discharge: 2023-07-06 | Disposition: A | Discharge status: Home / Self Care | Payer: Medicare HMO | Source: Ambulatory Visit | Attending: Internal Medicine | Admitting: Internal Medicine

## 2023-07-06 ENCOUNTER — Encounter (HOSPITAL_COMMUNITY): Payer: Self-pay | Admitting: *Deleted

## 2023-07-06 DIAGNOSIS — Z952 Presence of prosthetic heart valve: Secondary | ICD-10-CM

## 2023-07-06 LAB — GLUCOSE, CAPILLARY
Glucose-Capillary: 145 mg/dL — ABNORMAL HIGH (ref 70–99)
Glucose-Capillary: 110 mg/dL — ABNORMAL HIGH (ref 70–99)

## 2023-07-06 NOTE — Progress Notes (Signed)
Cardiac Individual Treatment Plan  Patient Details  Name: Beth Sloan MRN: [id number] Date of Birth: [birth year] Referring Provider:   Flowsheet Row CARDIAC REHAB PHASE II ORIENTATION from 06/23/2023 in [place] [facility]  Referring Provider [place]       Initial Encounter Date:  Flowsheet Row CARDIAC REHAB PHASE II ORIENTATION from 06/23/2023 in [place] CARDIAC REHABIL[facility]  Date 06/23/23       Visit Diagnosis: S/P TAVR (transcatheter aortic valve replacement)  Patient's Home Medications on Admission:  Current Outpatient Medications:    albuterol (VENTOLIN HFA) 108 (90 Base) MCG/ACT inhaler, Inhale 1 puff into the lungs every 6 (six) hours as needed for wheezing or shortness of breath., Disp: , Rfl:    apixaban (ELIQUIS) 5 MG TABS tablet, Take 5 mg by mouth 2 (two) times daily., Disp: , Rfl:    Ascorbic Acid (VITAMIN C) 1000 MG tablet, Take 1,000 mg by mouth., Disp: , Rfl:    aspirin EC 81 MG tablet, Take 81 mg by mouth. (Patient not taking: Reported on 06/23/2023), Disp: , Rfl:    atorvastatin (LIPITOR) 80 MG tablet, Take 40 mg by mouth. , Disp: , Rfl:    buPROPion (WELLBUTRIN SR) 150 MG 12 hr tablet, Take 150 mg by mouth daily., Disp: , Rfl:    calcium carbonate (OSCAL) 1500 (600 Ca) MG TABS tablet, Take 600 mg of elemental calcium by mouth daily with breakfast., Disp: , Rfl:    calcium carbonate (TUMS - DOSED IN MG ELEMENTAL CALCIUM) 500 MG chewable tablet, Chew 1 tablet by mouth daily., Disp: , Rfl:    Cyanocobalamin (RA VITAMIN B-12 TR) 1000 MCG TBCR, Take by mouth., Disp: , Rfl:    diclofenac Sodium (VOLTAREN) 1 % GEL, Apply 4 g topically 4 (four) times daily. As needed, Disp: , Rfl:    empagliflozin (JARDIANCE) 10 MG TABS tablet, Take 10 mg by mouth daily., Disp: , Rfl:    fluticasone (FLONASE) 50 MCG/ACT nasal spray, Place 1 spray into both nostrils daily. PRN, Disp: , Rfl:    furosemide (LASIX) 40 MG tablet, Take 40 mg by mouth daily. PRN for swelling,  Disp: , Rfl:    gabapentin (NEURONTIN) 300 MG capsule, Take 300 mg by mouth., Disp: , Rfl:    losartan (COZAAR) 50 MG tablet, Take 50 mg by mouth daily., Disp: , Rfl:    Melatonin 3 MG CAPS, Take 3 mg by mouth at bedtime. AT bedtime, Disp: , Rfl:    metoprolol tartrate (LOPRESSOR) 25 MG tablet, Take 25 mg by mouth 2 (two) times daily. , Disp: , Rfl:    Multiple Vitamin (MULTIVITAMIN) capsule, Take 1 capsule by mouth daily., Disp: , Rfl:    pantoprazole (PROTONIX) 40 MG tablet, Take 40 mg by mouth., Disp: , Rfl:    senna (SENOKOT) 8.6 MG TABS tablet, Take 1 tablet by mouth daily., Disp: , Rfl:    tizanidine (ZANAFLEX) 2 MG capsule, Take 2 mg by mouth at bedtime. Takes 1/2 tablet at bedtime, Disp: , Rfl:    traMADol (ULTRAM) 50 MG tablet, Take 50 mg by mouth. (Patient not taking: Reported on 06/23/2023), Disp: , Rfl:    umeclidinium-vilanterol (ANORO ELLIPTA) 62.5-25 MCG/ACT AEPB, Inhale 1 puff into the lungs daily., Disp: , Rfl:   Current Facility-Administered Medications:    dexamethasone (DECADRON) injection 10 mg, 10 mg, Other, Once, Delano Metz, MD   fentaNYL (SUBLIMAZE) injection 25-50 mcg, 25-50 mcg, Intravenous, Q5 min PRN, Delano Metz, MD   iopamidol (ISOVUE-M) 41 %  intrathecal injection 10 mL, 10 mL, Epidural, Once, Delano Metz, MD   lactated ringers infusion 1,000 mL, 1,000 mL, Intravenous, Once, Delano Metz, MD   midazolam (VERSED) 5 MG/5ML injection 1-2 mg, 1-2 mg, Intravenous, Q5 min PRN, Delano Metz, MD   orphenadrine (NORFLEX) injection 60 mg, 60 mg, Intramuscular, Once, Delano Metz, MD  Past Medical History: Past Medical History:  Diagnosis Date   Anxiety    Breast cancer (HCC)    Hypertension    TIA (transient ischemic attack)     Tobacco Use: Social History   Tobacco Use  Smoking Status Former   Current packs/day: 0.00   Types: Cigarettes   Quit date: 06/26/2015   Years since quitting: 8.0  Smokeless Tobacco Never     Labs: Review Flowsheet        No data to display          Capillary Blood Glucose: Lab Results  Component Value Date   GLUCAP 81 06/29/2023   GLUCAP 82 06/29/2023     Exercise Target Goals: Exercise Program Goal: Individual exercise prescription set using results from initial 6 min walk test and THRR while considering  patient's activity barriers and safety.   Exercise Prescription Goal: Starting with aerobic activity 30 plus minutes a day, 3 days per week for initial exercise prescription. Provide home exercise prescription and guidelines that participant acknowledges understanding prior to discharge.  Activity Barriers & Risk Stratification:  Activity Barriers & Cardiac Risk Stratification - 06/23/23 0836       Activity Barriers & Cardiac Risk Stratification   Activity Barriers Back Problems;Shortness of Breath;Balance Concerns;History of Falls;Assistive Device    Cardiac Risk Stratification Moderate             6 Minute Walk:  6 Minute Walk     Row Name 06/23/23 1135         6 Minute Walk   Phase Initial     Distance 350 feet     Walk Time 6 minutes     # of Rest Breaks 1     MPH 0.66     METS 0.37     RPE 12     Perceived Dyspnea  1     VO2 Peak 1.31     Symptoms Yes (comment)     Comments needed a seated break for 1 min 20 sec; used rollator while walking     Resting HR 72 bpm     Resting BP 92/50     Resting Oxygen Saturation  92 %     Exercise Oxygen Saturation  during 6 min walk 90 %     Max Ex. HR 85 bpm     Max Ex. BP 122/50     2 Minute Post BP 108/50              Oxygen Initial Assessment:   Oxygen Re-Evaluation:   Oxygen Discharge (Final Oxygen Re-Evaluation):   Initial Exercise Prescription:  Initial Exercise Prescription - 06/23/23 1100       Date of Initial Exercise RX and Referring Provider   Date 06/23/23    Referring Provider [place]      Oxygen   Maintain Oxygen Saturation 88% or higher      NuStep    Level 1    SPM 50    Minutes 30    METs 1      Prescription Details   Frequency (times per week) 3    Duration Progress to 30  minutes of continuous aerobic without signs/symptoms of physical distress      Intensity   THRR 40-80% of Max Heartrate 101-131    Ratings of Perceived Exertion 11-13    Perceived Dyspnea 0-4      Resistance Training   Training Prescription Yes    Weight 2    Reps 10-15             Perform Capillary Blood Glucose checks as needed.  Exercise Prescription Changes:   Exercise Prescription Changes     Row Name 07/04/23 1300             Response to Exercise   Blood Pressure (Admit) 130/70       Blood Pressure (Exit) 118/68       Heart Rate (Admit) 74 bpm       Heart Rate (Exercise) 90 bpm       Heart Rate (Exit) 80 bpm       Rating of Perceived Exertion (Exercise) 12       Duration Continue with 30 min of aerobic exercise without signs/symptoms of physical distress.       Intensity THRR unchanged         Progression   Progression Continue to progress workloads to maintain intensity without signs/symptoms of physical distress.         Resistance Training   Training Prescription Yes       Weight 2       Reps 10-15         NuStep   Level 1       SPM 90       Minutes 30       METs 1.8         Oxygen   Maintain Oxygen Saturation 88% or higher                Exercise Comments:   Exercise Comments     Row Name 06/29/23 1152           Exercise Comments First full day of exercise!  Patient was oriented to gym and equipment including functions, settings, policies, and procedures.  Patient's individual exercise prescription and treatment plan were reviewed.  All starting workloads were established based on the results of the 6 minute walk test done at initial orientation visit.  The plan for exercise progression was also introduced and progression will be customized based on patient's performance and goals.                 Exercise Goals and Review:   Exercise Goals     Row Name 06/23/23 1138             Exercise Goals   Increase Physical Activity Yes       Intervention Provide advice, education, support and counseling about physical activity/exercise needs.;Develop an individualized exercise prescription for aerobic and resistive training based on initial evaluation findings, risk stratification, comorbidities and participant's personal goals.       Expected Outcomes Short Term: Attend rehab on a regular basis to increase amount of physical activity.;Long Term: Exercising regularly at least 3-5 days a week.;Long Term: Add in home exercise to make exercise part of routine and to increase amount of physical activity.       Increase Strength and Stamina Yes       Intervention Provide advice, education, support and counseling about physical activity/exercise needs.;Develop an individualized exercise prescription for aerobic and resistive training based on initial evaluation findings,  risk stratification, comorbidities and participant's personal goals.       Expected Outcomes Short Term: Increase workloads from initial exercise prescription for resistance, speed, and METs.;Long Term: Improve cardiorespiratory fitness, muscular endurance and strength as measured by increased METs and functional capacity ( );Short Term: Perform resistance training exercises routinely during rehab and add in resistance training at home       Able to understand and use rate of perceived exertion (RPE) scale Yes       Intervention Provide education and explanation on how to use RPE scale       Expected Outcomes Short Term: Able to use RPE daily in rehab to express subjective intensity level;Long Term:  Able to use RPE to guide intensity level when exercising independently       Knowledge and understanding of Target Heart Rate Range (THRR) Yes       Intervention Provide education and explanation of THRR including how the numbers  were predicted and where they are located for reference       Expected Outcomes Short Term: Able to state/look up THRR;Long Term: Able to use THRR to govern intensity when exercising independently;Short Term: Able to use daily as guideline for intensity in rehab       Able to check pulse independently Yes       Intervention Review the importance of being able to check your own pulse for safety during independent exercise;Provide education and demonstration on how to check pulse in carotid and radial arteries.       Expected Outcomes Short Term: Able to explain why pulse checking is important during independent exercise;Long Term: Able to check pulse independently and accurately       Understanding of Exercise Prescription Yes       Intervention Provide education, explanation, and written materials on patient's individual exercise prescription       Expected Outcomes Short Term: Able to explain program exercise prescription;Long Term: Able to explain home exercise prescription to exercise independently                Exercise Goals Re-Evaluation :  Exercise Goals Re-Evaluation     Row Name 06/29/23 1152             Exercise Goal Re-Evaluation   Exercise Goals Review Able to understand and use rate of perceived exertion (RPE) scale;Knowledge and understanding of Target Heart Rate Range (THRR);Understanding of Exercise Prescription;Able to understand and use Dyspnea scale       Comments Reviewed RPE  and dyspnea scale, THR and program prescription with pt today.  Pt voiced understanding and was given a copy of goals to take home.       Expected Outcomes Short: Use RPE daily to regulate intensity.  Long: Follow program prescription in THR.                 Discharge Exercise Prescription (Final Exercise Prescription Changes):  Exercise Prescription Changes - 07/04/23 1300       Response to Exercise   Blood Pressure (Admit) 130/70    Blood Pressure (Exit) 118/68    Heart Rate  (Admit) 74 bpm    Heart Rate (Exercise) 90 bpm    Heart Rate (Exit) 80 bpm    Rating of Perceived Exertion (Exercise) 12    Duration Continue with 30 min of aerobic exercise without signs/symptoms of physical distress.    Intensity THRR unchanged      Progression   Progression Continue to progress workloads to maintain  intensity without signs/symptoms of physical distress.      Resistance Training   Training Prescription Yes    Weight 2    Reps 10-15      NuStep   Level 1    SPM 90    Minutes 30    METs 1.8      Oxygen   Maintain Oxygen Saturation 88% or higher             Nutrition:  Target Goals: Understanding of nutrition guidelines, daily intake of sodium 1500mg , cholesterol 200mg , calories 30% from fat and 7% or less from saturated fats, daily to have 5 or more servings of fruits and vegetables.  Biometrics:  Pre Biometrics - 06/23/23 1139       Pre Biometrics   Height 4\' 9"  (1.448 m)    Weight 146 lb 6.2 oz (66.4 kg)    Waist Circumference 43 inches    Hip Circumference 42 inches    Waist to Hip Ratio 1.02 %    BMI (Calculated) 31.67    Grip Strength 13.6 kg              Nutrition Therapy Plan and Nutrition Goals:   Nutrition Assessments:  MEDIFICTS Score Key: >=70 Need to make dietary changes  40-70 Heart Healthy Diet <= 40 Therapeutic Level Cholesterol Diet  Flowsheet Row CARDIAC REHAB PHASE II EXERCISE from 07/01/2023 in [facility]AC [facility]  Picture Your Plate Total Score on Admission 37      Picture Your Plate Scores: <16 Unhealthy dietary pattern with much room for improvement. 41-50 Dietary pattern unlikely to meet recommendations for good health and room for improvement. 51-60 More healthful dietary pattern, with some room for improvement.  >60 Healthy dietary pattern, although there may be some specific behaviors that could be improved.    Nutrition Goals Re-Evaluation:   Nutrition Goals Discharge (Final  Nutrition Goals Re-Evaluation):   Psychosocial: Target Goals: Acknowledge presence or absence of significant depression and/or stress, maximize coping skills, provide positive support system. Participant is able to verbalize types and ability to use techniques and skills needed for reducing stress and depression.  Initial Review & Psychosocial Screening:  Initial Psych Review & Screening - 06/23/23 1038       Initial Review   Current issues with Current Psychotropic Meds;None Identified      Family Dynamics   Good Support System? Yes   Her 2 daughter and 2 sisters.     Barriers   Psychosocial barriers to participate in program The patient should benefit from training in stress management and relaxation.;Psychosocial barriers identified (see note)      Screening Interventions   Interventions Encouraged to exercise;To provide support and resources with identified psychosocial needs;Provide feedback about the scores to participant    Expected Outcomes Short Term goal: Utilizing psychosocial counselor, staff and physician to assist with identification of specific Stressors or current issues interfering with healing process. Setting desired goal for each stressor or current issue identified.;Long Term Goal: Stressors or current issues are controlled or eliminated.;Short Term goal: Identification and review with participant of any Quality of Life or Depression concerns found by scoring the questionnaire.;Long Term goal: The participant improves quality of Life and PHQ9 Scores as seen by post scores and/or verbalization of changes             Quality of Life Scores:  Quality of Life - 06/30/23 1051       Quality of Life   Select Quality of  Life      Quality of Life Scores   Health/Function Pre 29.08 %    Socioeconomic Pre 30 %    Psych/Spiritual Pre 30 %    Family Pre 30 %    GLOBAL Pre 29.6 %            Scores of 19 and below usually indicate a poorer quality of life in  these areas.  A difference of  2-3 points is a clinically meaningful difference.  A difference of 2-3 points in the total score of the Quality of Life Index has been associated with significant improvement in overall quality of life, self-image, physical symptoms, and general health in studies assessing change in quality of life.  PHQ-9: Review Flowsheet       06/23/2023 07/27/2016 07/21/2016  Depression screen PHQ 2/9  Decreased Interest 1 0 0  Down, Depressed, Hopeless 0 0 0  PHQ - 2 Score 1 0 0  Altered sleeping 1 - -  Tired, decreased energy 1 - -  Change in appetite 2 - -  Feeling bad or failure about yourself  0 - -  Trouble concentrating 0 - -  Moving slowly or fidgety/restless 0 - -  Suicidal thoughts 0 - -  PHQ-9 Score 5 - -  Difficult doing work/chores Not difficult at all - -    Details           Interpretation of Total Score  Total Score Depression Severity:  1-4 = Minimal depression, 5-9 = Mild depression, 10-14 = Moderate depression, 15-19 = Moderately severe depression, 20-27 = Severe depression   Psychosocial Evaluation and Intervention:  Psychosocial Evaluation - 06/23/23 1038       Psychosocial Evaluation & Interventions   Interventions Stress management education;Relaxation education;Encouraged to exercise with the program and follow exercise prescription    Comments Patient was referred to CR with TAVR at [facility]. She is accompained by her daughter today. Her PHQ-9 score was 5 due to lack of energy, not eating a lot and sleeping too much. She says she has no trouble sleeping during the night but she thinks she sleeps too much during the day because her medication make her drowsy. She says she takes on average 2 naps during the day. She takes melotonin at bedtime and Zanaflex which help her sleep. She denies any depression, anxiety or stressors in her life. She quit smoking in 2016 when she had her aortic valve replaced and used Wellbutrin to help. She  remained on the medication and says she continues to take it to help her stay calm. She had to have her aortic valve replaced again 05/02/23 and she was discharged to an inpatient rehab facility. 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 She now uses a rolling walker but hopes to be able to ambulate without any assitive device after completing CR. She does live alone but her daughter stays with her during the week to be closer to her job and she has 2 sisters that stay with her over the weekend. She does not drive. Her daughter says she is going to try and work with [facility] transportation or a family member will bring her. She has no barriers to participate in the program. Her main goals for the program are to get stronger, have more energy and be able to walk without her walker.    Expected Outcomes Short Term: start the program and attend consistently. Long Term: get stronger; have more energy; walk  without her walker.    Continue Psychosocial Services  Follow up required by staff             Psychosocial Re-Evaluation:   Psychosocial Discharge (Final Psychosocial Re-Evaluation):   Vocational Rehabilitation: Provide vocational rehab assistance to qualifying candidates.   Vocational Rehab Evaluation & Intervention:  Vocational Rehab - 06/23/23 1010       Initial Vocational Rehab Evaluation & Intervention   Assessment shows need for Vocational Rehabilitation No      Vocational Rehab Re-Evaulation   Comments Retired.             Education: Education Goals: Education classes will be provided on a weekly basis, covering required topics. Participant will state understanding/return demonstration of topics presented.  Learning Barriers/Preferences:  Learning Barriers/Preferences - 06/23/23 1014       Learning Barriers/Preferences   Learning Barriers None    Learning Preferences Written Material;Audio;Skilled Demonstration             Education  Topics: Hypertension, Hypertension Reduction -Define heart disease and high blood pressure. Discus how high blood pressure affects the body and ways to reduce high blood pressure.   Exercise and Your Heart -Discuss why it is important to exercise, the FITT principles of exercise, normal and abnormal responses to exercise, and how to exercise safely.   Angina -Discuss definition of angina, causes of angina, treatment of angina, and how to decrease risk of having angina.   Cardiac Medications -Review what the following cardiac medications are used for, how they affect the body, and side effects that may occur when taking the medications.  Medications include Aspirin, Beta blockers, calcium channel blockers, ACE Inhibitors, angiotensin receptor blockers, diuretics, digoxin, and antihyperlipidemics.   Congestive Heart Failure -Discuss the definition of CHF, how to live with CHF, the signs and symptoms of CHF, and how keep track of weight and sodium intake.   Heart Disease and Intimacy -Discus the effect sexual activity has on the heart, how changes occur during intimacy as we age, and safety during sexual activity.   Smoking Cessation / COPD -Discuss different methods to quit smoking, the health benefits of quitting smoking, and the definition of COPD. Flowsheet Row CARDIAC REHAB PHASE II EXERCISE from 06/29/2023 in [place] CARDIAC REHABILITATION  Date 06/29/23  Educator [facility]  Instruction Review Code 1- Verbalizes Understanding       Nutrition I: Fats -Discuss the types of cholesterol, what cholesterol does to the heart, and how cholesterol levels can be controlled.   Nutrition II: Labels -Discuss the different components of food labels and how to read food label   Heart Parts/Heart Disease and PAD -Discuss the anatomy of the heart, the pathway of blood circulation through the heart, and these are affected by heart disease.   Stress I: Signs and Symptoms -Discuss the causes  of stress, how stress may lead to anxiety and depression, and ways to limit stress.   Stress II: Relaxation -Discuss different types of relaxation techniques to limit stress.   Warning Signs of Stroke / TIA -Discuss definition of a stroke, what the signs and symptoms are of a stroke, and how to identify when someone is having stroke.   Knowledge Questionnaire Score:  Knowledge Questionnaire Score - 06/30/23 0739       Knowledge Questionnaire Score   Pre Score 22/24             Core Components/Risk Factors/Patient Goals at Admission:  Personal Goals and Risk Factors at Admission - 06/23/23  1010       Core Components/Risk Factors/Patient Goals on Admission    Weight Management Weight Maintenance    Improve shortness of breath with ADL's Yes    Intervention Provide education, individualized exercise plan and daily activity instruction to help decrease symptoms of SOB with activities of daily living.    Expected Outcomes Short Term: Improve cardiorespiratory fitness to achieve a reduction of symptoms when performing ADLs;Long Term: Be able to perform more ADLs without symptoms or delay the onset of symptoms    Heart Failure Yes    Intervention Provide a combined exercise and nutrition program that is supplemented with education, support and counseling about heart failure. Directed toward relieving symptoms such as shortness of breath, decreased exercise tolerance, and extremity edema.    Expected Outcomes Short term: Attendance in program 2-3 days a week with increased exercise capacity. Reported lower sodium intake. Reported increased fruit and vegetable intake. Reports medication compliance.;Short term: Daily weights obtained and reported for increase. Utilizing diuretic protocols set by physician.;Long term: Adoption of self-care skills and reduction of barriers for early signs and symptoms recognition and intervention leading to self-care maintenance.    Hypertension Yes     Intervention Provide education on lifestyle modifcations including regular physical activity/exercise, weight management, moderate sodium restriction and increased consumption of fresh fruit, vegetables, and low fat dairy, alcohol moderation, and smoking cessation.;Monitor prescription use compliance.    Expected Outcomes Short Term: Continued assessment and intervention until BP is < 140/78mm HG in hypertensive participants. < 130/22mm HG in hypertensive participants with diabetes, heart failure or chronic kidney disease.;Long Term: Maintenance of blood pressure at goal levels.    Lipids Yes    Intervention Provide education and support for participant on nutrition & aerobic/resistive exercise along with prescribed medications to achieve LDL 70mg , HDL >40mg .    Expected Outcomes Short Term: Participant states understanding of desired cholesterol values and is compliant with medications prescribed. Participant is following exercise prescription and nutrition guidelines.;Long Term: Cholesterol controlled with medications as prescribed, with individualized exercise RX and with personalized nutrition plan. Value goals: LDL < 70mg , HDL > 40 mg.             Core Components/Risk Factors/Patient Goals Review:    Core Components/Risk Factors/Patient Goals at Discharge (Final Review):    ITP Comments:  ITP Comments     Row Name 06/29/23 1152 07/06/23 0819         ITP Comments First full day of exercise!  Patient was oriented to gym and equipment including functions, settings, policies, and procedures.  Patient's individual exercise prescription and treatment plan were reviewed.  All starting workloads were established based on the results of the 6 minute walk test done at initial orientation visit.  The plan for exercise progression was also introduced and progression will be customized based on patient's performance and goals. 30 day review completed. ITP sent to Dr. Dina Rich, Medical  Director of Cardiac Rehab. Continue with ITP unless changes are made by physician.  Pt is new to program.               Comments: 30 day review

## 2023-07-06 NOTE — Progress Notes (Signed)
Daily Session Note  Patient Details  Name: Beth Sloan MRN: [id number] Date of Birth: December 07, 1945 Referring Provider:   Flowsheet Row CARDIAC REHAB PHASE II ORIENTATION from 06/23/2023 in [facility] CARDIAC REHABILITATION  Referring Provider Vavalle       Encounter Date: 07/06/2023  Check In:  Session Check In - 07/06/23 1030       Check-In   Supervising physician immediately available to respond to emergencies See telemetry face sheet for immediately available MD    Location [facility]    Staff Present [name], BS, Exercise Physiologist;Teodora Baumgarten Daphine Deutscher, RN, BSN;Hillary Troutman BSN, RN;Jessica [place], MA, RCEP, CCRP, CCET    Virtual Visit No    Medication changes reported     No    Fall or balance concerns reported    Yes    Comments Patient has history of falls; uses rolling walker and reports losing her balance at times.    Tobacco Cessation No Change    Warm-up and Cool-down Performed on first and last piece of equipment    Resistance Training Performed Yes    VAD Patient? No      Pain Assessment   Currently in Pain? No/denies             Capillary Blood Glucose: No results found for this or any previous visit (from the past 24 hour(s)).    Social History   Tobacco Use  Smoking Status Former   Current packs/day: 0.00   Types: Cigarettes   Quit date: 06/26/2015   Years since quitting: 8.0  Smokeless Tobacco Never    Goals Met:  Independence with exercise equipment Exercise tolerated well No report of concerns or symptoms today Strength training completed today  Goals Unmet:  Not Applicable  Comments: Pt able to follow exercise prescription today without complaint.  Will continue to monitor for progression.    Dr. Dina Rich is Medical Director for [facility] Cardiac Rehab

## 2023-07-08 ENCOUNTER — Encounter (HOSPITAL_COMMUNITY)
Admission: RE | Admit: 2023-07-08 | Discharge: 2023-07-08 | Disposition: A | Discharge status: Home / Self Care | Payer: Medicare HMO | Source: Ambulatory Visit | Attending: Internal Medicine | Admitting: Internal Medicine

## 2023-07-08 DIAGNOSIS — Z952 Presence of prosthetic heart valve: Secondary | ICD-10-CM

## 2023-07-08 NOTE — Progress Notes (Signed)
Daily Session Note  Patient Details  Name: Beth Sloan MRN: [id number] Date of Birth: 16-Jul-1946 Referring Provider:   Flowsheet Row CARDIAC REHAB PHASE II ORIENTATION from 06/23/2023 in [facility] CARDIAC REHABILITATION  Referring Provider Vavalle       Encounter Date: 07/08/2023  Check In:  Session Check In - 07/08/23 1100       Check-In   Supervising physician immediately available to respond to emergencies See telemetry face sheet for immediately available MD    Location [facility]    Staff Present [name], BS, Exercise Physiologist;Debra Laural Benes, RN, Pleas Koch, RN, BSN;Jessica [place], MA, RCEP, CCRP, CCET    Virtual Visit No    Medication changes reported     No    Fall or balance concerns reported    Yes    Comments Patient has history of falls; uses rolling walker and reports losing her balance at times.    Tobacco Cessation No Change    Warm-up and Cool-down Performed on first and last piece of equipment    Resistance Training Performed Yes    VAD Patient? No    PAD/SET Patient? No      Pain Assessment   Currently in Pain? No/denies    Multiple Pain Sites No             Capillary Blood Glucose: No results found for this or any previous visit (from the past 24 hour(s)).    Social History   Tobacco Use  Smoking Status Former   Current packs/day: 0.00   Types: Cigarettes   Quit date: 06/26/2015   Years since quitting: 8.0  Smokeless Tobacco Never    Goals Met:  Independence with exercise equipment Exercise tolerated well No report of concerns or symptoms today Strength training completed today  Goals Unmet:  Not Applicable .exg  Comments Pt able to follow exercise prescription today without complaint.  Will continue to monitor for progression.    Dr. Dina Rich is Medical Director for [facility] Cardiac Rehab

## 2023-07-11 ENCOUNTER — Encounter (HOSPITAL_COMMUNITY)
Admission: RE | Admit: 2023-07-11 | Discharge: 2023-07-11 | Disposition: A | Discharge status: Home / Self Care | Payer: Medicare HMO | Source: Ambulatory Visit | Attending: Internal Medicine | Admitting: Internal Medicine

## 2023-07-11 DIAGNOSIS — Z952 Presence of prosthetic heart valve: Secondary | ICD-10-CM

## 2023-07-11 NOTE — Progress Notes (Signed)
Daily Session Note  Patient Details  Name: Beth Sloan MRN: [id number] Date of Birth: Apr 08, 1946 Referring Provider:   Flowsheet Row CARDIAC REHAB PHASE II ORIENTATION from 06/23/2023 in [facility] CARDIAC REHABILITATION  Referring Provider Vavalle       Encounter Date: 07/11/2023  Check In:  Session Check In - 07/11/23 1100       Check-In   Supervising physician immediately available to respond to emergencies See telemetry face sheet for immediately available MD    Location [facility]    Staff Present [name], BS, Exercise Physiologist;Debra Laural Benes, RN, BSN;Usbaldo Pannone, RN;Jessica [place], MA, RCEP, CCRP, CCET    Virtual Visit No    Medication changes reported     No    Fall or balance concerns reported    Yes    Comments Patient reports having a fall yesterday at church, no injury noted, "knee gave away".  Patient has history of falls; uses rolling walker and reports losing her balance at times    Tobacco Cessation No Change    Warm-up and Cool-down Performed on first and last piece of equipment    Resistance Training Performed Yes    VAD Patient? No    PAD/SET Patient? No      Pain Assessment   Currently in Pain? No/denies    Multiple Pain Sites No             Capillary Blood Glucose: No results found for this or any previous visit (from the past 24 hour(s)).    Social History   Tobacco Use  Smoking Status Former   Current packs/day: 0.00   Types: Cigarettes   Quit date: 06/26/2015   Years since quitting: 8.0  Smokeless Tobacco Never    Goals Met:  Independence with exercise equipment Exercise tolerated well No report of concerns or symptoms today  Goals Unmet:  Not Applicable  Comments: Pt able to follow exercise prescription today without complaint.  Will continue to monitor for progression.    Dr. Dina Rich is Medical Director for [facility] Cardiac Rehab

## 2023-07-13 ENCOUNTER — Encounter (HOSPITAL_COMMUNITY)
Admission: RE | Admit: 2023-07-13 | Discharge: 2023-07-13 | Disposition: A | Discharge status: Home / Self Care | Payer: Medicare HMO | Source: Ambulatory Visit | Attending: Internal Medicine

## 2023-07-13 DIAGNOSIS — Z952 Presence of prosthetic heart valve: Secondary | ICD-10-CM

## 2023-07-13 LAB — GLUCOSE, CAPILLARY: Glucose-Capillary: 81 mg/dL (ref 70–99)

## 2023-07-13 NOTE — Progress Notes (Signed)
Daily Session Note  Patient Details  Name: Beth Sloan MRN: [id number] Date of Birth: 05-08-1946 Referring Provider:   Flowsheet Row CARDIAC REHAB PHASE II ORIENTATION from 06/23/2023 in [facility] CARDIAC REHABILITATION  Referring Provider Vavalle       Encounter Date: 07/13/2023  Check In:  Session Check In - 07/13/23 1020       Check-In   Supervising physician immediately available to respond to emergencies [facility] MD immediately available    Physician(s) Dr. Jenene Slicker    Location [facility]    Staff Present [name], BS, Exercise Physiologist;Iria Jamerson BSN, RN;Jessica [place], [place], RCEP, CCRP, CCET    Virtual Visit No    Medication changes reported     No    Fall or balance concerns reported    Yes    Comments Pt has a hx of falls, uses rolling walker and reports losing her balance at times    Tobacco Cessation No Change    Warm-up and Cool-down Performed on first and last piece of equipment    Resistance Training Performed Yes    VAD Patient? No    PAD/SET Patient? No      Pain Assessment   Currently in Pain? No/denies    Multiple Pain Sites No             Capillary Blood Glucose: No results found for this or any previous visit (from the past 24 hour(s)).    Social History   Tobacco Use  Smoking Status Former   Current packs/day: 0.00   Types: Cigarettes   Quit date: 06/26/2015   Years since quitting: 8.0  Smokeless Tobacco Never    Goals Met:  Independence with exercise equipment Exercise tolerated well No report of concerns or symptoms today Strength training completed today  Goals Unmet:  Not Applicable  Comments: Marland KitchenMarland KitchenPt able to follow exercise prescription today without complaint.  Will continue to monitor for progression.    Dr. Dina Rich is Medical Director for [facility] Cardiac Rehab

## 2023-07-15 ENCOUNTER — Encounter (HOSPITAL_COMMUNITY)
Admission: RE | Admit: 2023-07-15 | Discharge: 2023-07-15 | Disposition: A | Discharge status: Home / Self Care | Payer: Medicare HMO | Source: Ambulatory Visit | Attending: Internal Medicine

## 2023-07-15 DIAGNOSIS — Z952 Presence of prosthetic heart valve: Secondary | ICD-10-CM | POA: Diagnosis not present

## 2023-07-15 NOTE — Progress Notes (Signed)
Daily Session Note  Patient Details  Name: Beth Sloan MRN: [id number] Date of Birth: 28-Aug-1946 Referring Provider:   Flowsheet Row CARDIAC REHAB PHASE II ORIENTATION from 06/23/2023 in [facility] CARDIAC REHABILITATION  Referring Provider Vavalle       Encounter Date: 07/15/2023  Check In:  Session Check In - 07/15/23 1045       Check-In   Supervising physician immediately available to respond to emergencies See telemetry face sheet for immediately available ER MD    Location [facility]    Staff Present [name], BS, Exercise Physiologist;Jessica [place], MA, RCEP, CCRP, Dow Adolph, RN, Pleas Koch, RN, BSN    Virtual Visit No    Medication changes reported     No    Fall or balance concerns reported    Yes    Comments Pt has a hx of falls, uses rolling walker and reports losing her balance at times    Warm-up and Cool-down Performed on first and last piece of equipment    Resistance Training Performed Yes    VAD Patient? No    PAD/SET Patient? No      Pain Assessment   Currently in Pain? No/denies    Multiple Pain Sites No             Capillary Blood Glucose: No results found for this or any previous visit (from the past 24 hour(s)).    Social History   Tobacco Use  Smoking Status Former   Current packs/day: 0.00   Types: Cigarettes   Quit date: 06/26/2015   Years since quitting: 8.0  Smokeless Tobacco Never    Goals Met:  Independence with exercise equipment Exercise tolerated well No report of concerns or symptoms today Strength training completed today  Goals Unmet:  Not Applicable  Comments: Pt able to follow exercise prescription today without complaint.  Will continue to monitor for progression.    Dr. Dina Rich is Medical Director for [facility] Cardiac Rehab

## 2023-07-18 ENCOUNTER — Encounter (HOSPITAL_COMMUNITY)
Admission: RE | Admit: 2023-07-18 | Discharge: 2023-07-18 | Disposition: A | Discharge status: Home / Self Care | Payer: Medicare HMO | Source: Ambulatory Visit | Attending: Internal Medicine | Admitting: Internal Medicine

## 2023-07-18 DIAGNOSIS — Z952 Presence of prosthetic heart valve: Secondary | ICD-10-CM

## 2023-07-18 NOTE — Progress Notes (Signed)
Daily Session Note  Patient Details  Name: Beth Sloan MRN: [id number] Date of Birth: April 14, 1946 Referring Provider:   Flowsheet Row CARDIAC REHAB PHASE II ORIENTATION from 06/23/2023 in [facility] CARDIAC REHABILITATION  Referring Provider Vavalle       Encounter Date: 07/18/2023  Check In:  Session Check In - 07/18/23 1100       Check-In   Supervising physician immediately available to respond to emergencies See telemetry face sheet for immediately available MD    Location [facility]    Staff Present [name], BS, Exercise Physiologist;Debra Laural Benes, RN, BSN;[place], RN;[place]aphyne [place], RN, BSN;Jessica [place], MA, RCEP, CCRP, CCET    Virtual Visit No    Medication changes reported     No    Fall or balance concerns reported    Yes    Comments Pt has a hx of falls, uses rolling walker and reports losing her balance at times    Tobacco Cessation No Change    Warm-up and Cool-down Performed on first and last piece of equipment    Resistance Training Performed Yes    VAD Patient? No    PAD/SET Patient? No      Pain Assessment   Currently in Pain? No/denies    Multiple Pain Sites No             Capillary Blood Glucose: No results found for this or any previous visit (from the past 24 hour(s)).    Social History   Tobacco Use  Smoking Status Former   Current packs/day: 0.00   Types: Cigarettes   Quit date: 06/26/2015   Years since quitting: 8.0  Smokeless Tobacco Never    Goals Met:  Independence with exercise equipment Exercise tolerated well No report of concerns or symptoms today Strength training completed today  Goals Unmet:  Not Applicable  Comments: Pt able to follow exercise prescription today without complaint.  Will continue to monitor for progression.    Dr. Dina Rich is Medical Director for [facility] Cardiac Rehab

## 2023-07-20 ENCOUNTER — Encounter (HOSPITAL_COMMUNITY)
Admission: RE | Admit: 2023-07-20 | Discharge: 2023-07-20 | Disposition: A | Discharge status: Home / Self Care | Payer: Medicare HMO | Source: Ambulatory Visit | Attending: Internal Medicine | Admitting: Internal Medicine

## 2023-07-20 DIAGNOSIS — Z952 Presence of prosthetic heart valve: Secondary | ICD-10-CM

## 2023-07-20 NOTE — Progress Notes (Signed)
Daily Session Note  Patient Details  Name: Beth Sloan MRN: [id number] Date of Birth: Apr 11, 1946 Referring Provider:   Flowsheet Row CARDIAC REHAB PHASE II ORIENTATION from 06/23/2023 in [facility] CARDIAC REHABILITATION  Referring Provider Vavalle       Encounter Date: 07/20/2023  Check In:  Session Check In - 07/20/23 1043       Check-In   Supervising physician immediately available to respond to emergencies See telemetry face sheet for immediately available MD    Location [facility]    Staff Present [name], BS, Exercise Physiologist;Jessica [place], MA, RCEP, CCRP, CCET;Hillary Research scientist (life sciences), RN    Virtual Visit No    Medication changes reported     No    Fall or balance concerns reported    Yes    Comments Pt has a hx of falls, uses rolling walker and reports losing her balance at times    Tobacco Cessation No Change    Warm-up and Cool-down Performed on first and last piece of equipment    Resistance Training Performed Yes    VAD Patient? No    PAD/SET Patient? No      Pain Assessment   Currently in Pain? No/denies    Multiple Pain Sites No             Capillary Blood Glucose: No results found for this or any previous visit (from the past 24 hour(s)).    Social History   Tobacco Use  Smoking Status Former   Current packs/day: 0.00   Types: Cigarettes   Quit date: 06/26/2015   Years since quitting: 8.0  Smokeless Tobacco Never    Goals Met:  Independence with exercise equipment Exercise tolerated well No report of concerns or symptoms today Strength training completed today  Goals Unmet:  Not Applicable  Comments: Pt able to follow exercise prescription today without complaint.  Will continue to monitor for progression.    Dr. Dina Rich is Medical Director for [facility] Cardiac Rehab

## 2023-07-22 ENCOUNTER — Encounter (HOSPITAL_COMMUNITY): Payer: Medicare HMO

## 2023-07-25 ENCOUNTER — Encounter (HOSPITAL_COMMUNITY)
Admission: RE | Admit: 2023-07-25 | Discharge: 2023-07-25 | Disposition: A | Discharge status: Home / Self Care | Payer: Medicare HMO | Source: Ambulatory Visit | Attending: Internal Medicine

## 2023-07-25 DIAGNOSIS — Z952 Presence of prosthetic heart valve: Secondary | ICD-10-CM | POA: Diagnosis not present

## 2023-07-25 NOTE — Progress Notes (Signed)
Daily Session Note  Patient Details  Name: Beth Sloan MRN: [id number] Date of Birth: 11/05/1945 Referring Provider:   Flowsheet Row CARDIAC REHAB PHASE II ORIENTATION from 06/23/2023 in [facility] CARDIAC REHABILITATION  Referring Provider Vavalle       Encounter Date: 07/25/2023  Check In:  Session Check In - 07/25/23 1045       Check-In   Supervising physician immediately available to respond to emergencies See telemetry face sheet for immediately available ER MD    Location [facility]    Staff Present Rodena Medin, RN, BSN;Jessica Juanetta [place], MA, RCEP, CCRP, CCET;Heather Fredric Mare, BS, Exercise Physiologist;Hillary Leonidas Romberg BSN, RN    Virtual Visit No    Medication changes reported     No    Fall or balance concerns reported    Yes    Comments Pt has a hx of falls, uses rolling walker and reports losing her balance at times    Warm-up and Cool-down Performed on first and last piece of equipment    Resistance Training Performed Yes    VAD Patient? No    PAD/SET Patient? No      Pain Assessment   Currently in Pain? No/denies    Multiple Pain Sites No             Capillary Blood Glucose: No results found for this or any previous visit (from the past 24 hour(s)).    Social History   Tobacco Use  Smoking Status Former   Current packs/day: 0.00   Types: Cigarettes   Quit date: 06/26/2015   Years since quitting: 8.0  Smokeless Tobacco Never    Goals Met:  Independence with exercise equipment Exercise tolerated well No report of concerns or symptoms today Strength training completed today  Goals Unmet:  Not Applicable  Comments: Pt able to follow exercise prescription today without complaint.  Will continue to monitor for progression.    Dr. Dina Rich is Medical Director for [facility] Cardiac Rehab

## 2023-07-27 ENCOUNTER — Encounter (HOSPITAL_COMMUNITY)
Admission: RE | Admit: 2023-07-27 | Discharge: 2023-07-27 | Disposition: A | Discharge status: Home / Self Care | Payer: Medicare HMO | Source: Ambulatory Visit | Attending: Internal Medicine | Admitting: Internal Medicine

## 2023-07-27 DIAGNOSIS — Z952 Presence of prosthetic heart valve: Secondary | ICD-10-CM | POA: Diagnosis present

## 2023-07-27 NOTE — Progress Notes (Signed)
Daily Session Note  Patient Details  Name: Beth Sloan MRN: [id number] Date of Birth: 1946/06/02 Referring Provider:   Flowsheet Row CARDIAC REHAB PHASE II ORIENTATION from 06/23/2023 in [facility] CARDIAC REHABILITATION  Referring Provider Vavalle       Encounter Date: 07/27/2023  Check In:  Session Check In - 07/27/23 1100       Check-In   Supervising physician immediately available to respond to emergencies See telemetry face sheet for immediately available MD    Location [facility]    Staff Present [name], BS, Exercise Physiologist;Hillary Leonidas Romberg BSN, RN    Virtual Visit No    Medication changes reported     No    Fall or balance concerns reported    Yes    Comments Pt has a hx of falls, uses rolling walker and reports losing her balance at times    Tobacco Cessation No Change    Warm-up and Cool-down Performed on first and last piece of equipment    Resistance Training Performed Yes    VAD Patient? No    PAD/SET Patient? No      Pain Assessment   Currently in Pain? No/denies    Multiple Pain Sites No             Capillary Blood Glucose: No results found for this or any previous visit (from the past 24 hour(s)).    Social History   Tobacco Use  Smoking Status Former   Current packs/day: 0.00   Types: Cigarettes   Quit date: 06/26/2015   Years since quitting: 8.0  Smokeless Tobacco Never    Goals Met:  Independence with exercise equipment Exercise tolerated well No report of concerns or symptoms today Strength training completed today  Goals Unmet:  Not Applicable  Comments: Pt able to follow exercise prescription today without complaint.  Will continue to monitor for progression.    Dr. Dina Rich is Medical Director for [facility] Cardiac Rehab

## 2023-07-29 ENCOUNTER — Encounter (HOSPITAL_COMMUNITY)
Admission: RE | Admit: 2023-07-29 | Discharge: 2023-07-29 | Disposition: A | Discharge status: Home / Self Care | Payer: Medicare HMO | Source: Ambulatory Visit | Attending: Internal Medicine | Admitting: Internal Medicine

## 2023-07-29 DIAGNOSIS — Z952 Presence of prosthetic heart valve: Secondary | ICD-10-CM | POA: Diagnosis not present

## 2023-07-29 NOTE — Progress Notes (Signed)
Daily Session Note  Patient Details  Name: Beth Sloan MRN: [id number] Date of Birth: 06/29/1946 Referring Provider:   Flowsheet Row CARDIAC REHAB PHASE II ORIENTATION from 06/23/2023 in [facility] CARDIAC REHABILITATION  Referring Provider Vavalle       Encounter Date: 07/29/2023  Check In:  Session Check In - 07/29/23 1045       Check-In   Supervising physician immediately available to respond to emergencies See telemetry face sheet for immediately available ER MD    Location [facility]    Staff Present Rodena Medin, RN, BSN;Jessica [place], MA, RCEP, CCRP, CCET;Hillary [employer], RN    Virtual Visit No    Medication changes reported     No    Fall or balance concerns reported    Yes    Comments Pt has a hx of falls, uses rolling walker and reports losing her balance at times    Warm-up and Cool-down Performed on first and last piece of equipment    Resistance Training Performed Yes    VAD Patient? No    PAD/SET Patient? No      Pain Assessment   Currently in Pain? No/denies    Multiple Pain Sites No             Capillary Blood Glucose: No results found for this or any previous visit (from the past 24 hour(s)).    Social History   Tobacco Use  Smoking Status Former   Current packs/day: 0.00   Types: Cigarettes   Quit date: 06/26/2015   Years since quitting: 8.0  Smokeless Tobacco Never    Goals Met:  Independence with exercise equipment Exercise tolerated well No report of concerns or symptoms today Strength training completed today  Goals Unmet:  Not Applicable  Comments: Pt able to follow exercise prescription today without complaint.  Will continue to monitor for progression.    Dr. Dina Rich is Medical Director for [facility] Cardiac Rehab

## 2023-08-01 ENCOUNTER — Other Ambulatory Visit (HOSPITAL_COMMUNITY): Payer: Self-pay | Admitting: Family Medicine

## 2023-08-01 ENCOUNTER — Encounter (HOSPITAL_COMMUNITY)
Admission: RE | Admit: 2023-08-01 | Discharge: 2023-08-01 | Disposition: A | Discharge status: Home / Self Care | Payer: Medicare HMO | Source: Ambulatory Visit | Attending: Internal Medicine

## 2023-08-01 DIAGNOSIS — Z952 Presence of prosthetic heart valve: Secondary | ICD-10-CM

## 2023-08-01 DIAGNOSIS — Z1231 Encounter for screening mammogram for malignant neoplasm of breast: Secondary | ICD-10-CM

## 2023-08-01 NOTE — Progress Notes (Signed)
Daily Session Note  Patient Details  Name: Beth Sloan MRN: [id number] Date of Birth: 01/28/1946 Referring Provider:   Flowsheet Row CARDIAC REHAB PHASE II ORIENTATION from 06/23/2023 in [facility] CARDIAC REHABILITATION  Referring Provider Vavalle       Encounter Date: 08/01/2023  Check In:  Session Check In - 08/01/23 1045       Check-In   Supervising physician immediately available to respond to emergencies See telemetry face sheet for immediately available ER MD    Location [facility]    Staff Present Rodena Medin, RN, Pleas Koch, RN, BSN    Virtual Visit No    Medication changes reported     No    Fall or balance concerns reported    Yes    Comments Pt has a hx of falls, uses rolling walker and reports losing her balance at times    Warm-up and Cool-down Performed on first and last piece of equipment    Resistance Training Performed Yes    VAD Patient? No    PAD/SET Patient? No      Pain Assessment   Currently in Pain? No/denies    Multiple Pain Sites No             Capillary Blood Glucose: No results found for this or any previous visit (from the past 24 hour(s)).    Social History   Tobacco Use  Smoking Status Former   Current packs/day: 0.00   Types: Cigarettes   Quit date: 06/26/2015   Years since quitting: 8.1  Smokeless Tobacco Never    Goals Met:  Independence with exercise equipment Exercise tolerated well No report of concerns or symptoms today Strength training completed today  Goals Unmet:  Not Applicable  Comments: Pt able to follow exercise prescription today without complaint.  Will continue to monitor for progression.    Dr. Dina Rich is Medical Director for [facility] Cardiac Rehab

## 2023-08-03 ENCOUNTER — Encounter (HOSPITAL_COMMUNITY)
Admission: RE | Admit: 2023-08-03 | Discharge: 2023-08-03 | Disposition: A | Discharge status: Home / Self Care | Payer: Medicare HMO | Source: Ambulatory Visit | Attending: Internal Medicine | Admitting: Internal Medicine

## 2023-08-03 ENCOUNTER — Encounter (HOSPITAL_COMMUNITY): Payer: Self-pay | Admitting: *Deleted

## 2023-08-03 DIAGNOSIS — Z952 Presence of prosthetic heart valve: Secondary | ICD-10-CM

## 2023-08-03 NOTE — Progress Notes (Signed)
Daily Session Note  Patient Details  Name: Beth Sloan MRN: [id number] Date of Birth: 05/13/1946 Referring Provider:   Flowsheet Row CARDIAC REHAB PHASE II ORIENTATION from 06/23/2023 in [facility] CARDIAC REHABILITATION  Referring Provider Vavalle       Encounter Date: 08/03/2023  Check In:  Session Check In - 08/03/23 1037       Check-In   Supervising physician immediately available to respond to emergencies See telemetry face sheet for immediately available MD    Location [facility]    Staff Present [name], BS, Exercise Physiologist;Jessica Juanetta [place], MA, RCEP, CCRP, CCET    Virtual Visit No    Medication changes reported     No    Fall or balance concerns reported    Yes    Comments Pt has a hx of falls, uses rolling walker and reports losing her balance at times    Tobacco Cessation No Change    Warm-up and Cool-down Performed on first and last piece of equipment    Resistance Training Performed Yes    VAD Patient? No    PAD/SET Patient? No      Pain Assessment   Currently in Pain? No/denies    Multiple Pain Sites No             Capillary Blood Glucose: No results found for this or any previous visit (from the past 24 hour(s)).    Social History   Tobacco Use  Smoking Status Former   Current packs/day: 0.00   Types: Cigarettes   Quit date: 06/26/2015   Years since quitting: 8.1  Smokeless Tobacco Never    Goals Met:  Independence with exercise equipment Exercise tolerated well No report of concerns or symptoms today Strength training completed today  Goals Unmet:  Not Applicable  Comments: Pt able to follow exercise prescription today without complaint.  Will continue to monitor for progression.    Dr. Dina Rich is Medical Director for [facility] Cardiac Rehab

## 2023-08-03 NOTE — Progress Notes (Signed)
Cardiac Individual Treatment Plan  Patient Details  Name: Royalty Fakhouri Gartner MRN: [id number] Date of Birth: 25-Jan-1946 Referring Provider:   Flowsheet Row CARDIAC REHAB PHASE II ORIENTATION from 06/23/2023 in [place] [facility]  Referring Provider Vavalle       Initial Encounter Date:  Flowsheet Row CARDIAC REHAB PHASE II ORIENTATION from 06/23/2023 in [place] [facility]  Date 06/23/23       Visit Diagnosis: S/P TAVR (transcatheter aortic valve replacement)  Patient's Home Medications on Admission:  Current Outpatient Medications:    albuterol (VENTOLIN HFA) 108 (90 Base) MCG/ACT inhaler, Inhale 1 puff into the lungs every 6 (six) hours as needed for wheezing or shortness of breath., Disp: , Rfl:    apixaban (ELIQUIS) 5 MG TABS tablet, Take 5 mg by mouth 2 (two) times daily., Disp: , Rfl:    Ascorbic Acid (VITAMIN C) 1000 MG tablet, Take 1,000 mg by mouth., Disp: , Rfl:    aspirin EC 81 MG tablet, Take 81 mg by mouth. (Patient not taking: Reported on 06/23/2023), Disp: , Rfl:    atorvastatin (LIPITOR) 80 MG tablet, Take 40 mg by mouth. , Disp: , Rfl:    buPROPion (WELLBUTRIN SR) 150 MG 12 hr tablet, Take 150 mg by mouth daily., Disp: , Rfl:    calcium carbonate (OSCAL) 1500 (600 Ca) MG TABS tablet, Take 600 mg of elemental calcium by mouth daily with breakfast., Disp: , Rfl:    calcium carbonate (TUMS - DOSED IN MG ELEMENTAL CALCIUM) 500 MG chewable tablet, Chew 1 tablet by mouth daily., Disp: , Rfl:    Cyanocobalamin (RA VITAMIN B-12 TR) 1000 MCG TBCR, Take by mouth., Disp: , Rfl:    diclofenac Sodium (VOLTAREN) 1 % GEL, Apply 4 g topically 4 (four) times daily. As needed, Disp: , Rfl:    empagliflozin (JARDIANCE) 10 MG TABS tablet, Take 10 mg by mouth daily., Disp: , Rfl:    fluticasone (FLONASE) 50 MCG/ACT nasal spray, Place 1 spray into both nostrils daily. PRN, Disp: , Rfl:    furosemide (LASIX) 40 MG tablet, Take 40 mg by mouth daily. PRN for swelling,  Disp: , Rfl:    gabapentin (NEURONTIN) 300 MG capsule, Take 300 mg by mouth., Disp: , Rfl:    losartan (COZAAR) 50 MG tablet, Take 50 mg by mouth daily., Disp: , Rfl:    Melatonin 3 MG CAPS, Take 3 mg by mouth at bedtime. AT bedtime, Disp: , Rfl:    metoprolol tartrate (LOPRESSOR) 25 MG tablet, Take 25 mg by mouth 2 (two) times daily. , Disp: , Rfl:    Multiple Vitamin (MULTIVITAMIN) capsule, Take 1 capsule by mouth daily., Disp: , Rfl:    pantoprazole (PROTONIX) 40 MG tablet, Take 40 mg by mouth., Disp: , Rfl:    senna (SENOKOT) 8.6 MG TABS tablet, Take 1 tablet by mouth daily., Disp: , Rfl:    tizanidine (ZANAFLEX) 2 MG capsule, Take 2 mg by mouth at bedtime. Takes 1/2 tablet at bedtime, Disp: , Rfl:    traMADol (ULTRAM) 50 MG tablet, Take 50 mg by mouth. (Patient not taking: Reported on 06/23/2023), Disp: , Rfl:    umeclidinium-vilanterol (ANORO ELLIPTA) 62.5-25 MCG/ACT AEPB, Inhale 1 puff into the lungs daily., Disp: , Rfl:   Current Facility-Administered Medications:    dexamethasone (DECADRON) injection 10 mg, 10 mg, Other, Once, Delano Metz, MD   fentaNYL (SUBLIMAZE) injection 25-50 mcg, 25-50 mcg, Intravenous, Q5 min PRN, Delano Metz, MD   iopamidol (ISOVUE-M) 41 %  intrathecal injection 10 mL, 10 mL, Epidural, Once, Delano Metz, MD   lactated ringers infusion 1,000 mL, 1,000 mL, Intravenous, Once, Delano Metz, MD   midazolam (VERSED) 5 MG/5ML injection 1-2 mg, 1-2 mg, Intravenous, Q5 min PRN, Delano Metz, MD   orphenadrine (NORFLEX) injection 60 mg, 60 mg, Intramuscular, Once, Delano Metz, MD  Past Medical History: Past Medical History:  Diagnosis Date   Anxiety    Breast cancer (HCC)    Hypertension    TIA (transient ischemic attack)     Tobacco Use: Social History   Tobacco Use  Smoking Status Former   Current packs/day: 0.00   Types: Cigarettes   Quit date: 06/26/2015   Years since quitting: 8.1  Smokeless Tobacco Never     Labs: Review Flowsheet        No data to display          Capillary Blood Glucose: Lab Results  Component Value Date   GLUCAP 81 07/13/2023   GLUCAP 110 (H) 07/06/2023   GLUCAP 145 (H) 07/06/2023   GLUCAP 81 06/29/2023   GLUCAP 82 06/29/2023     Exercise Target Goals: Exercise Program Goal: Individual exercise prescription set using results from initial 6 min walk test and THRR while considering  patient's activity barriers and safety.   Exercise Prescription Goal: Starting with aerobic activity 30 plus minutes a day, 3 days per week for initial exercise prescription. Provide home exercise prescription and guidelines that participant acknowledges understanding prior to discharge.  Activity Barriers & Risk Stratification:  Activity Barriers & Cardiac Risk Stratification - 06/23/23 0836       Activity Barriers & Cardiac Risk Stratification   Activity Barriers Back Problems;Shortness of Breath;Balance Concerns;History of Falls;Assistive Device    Cardiac Risk Stratification Moderate             6 Minute Walk:  6 Minute Walk     Row Name 06/23/23 1135         6 Minute Walk   Phase Initial     Distance 350 feet     Walk Time 6 minutes     # of Rest Breaks 1     MPH 0.66     METS 0.37     RPE 12     Perceived Dyspnea  1     VO2 Peak 1.31     Symptoms Yes (comment)     Comments needed a seated break for 1 min 20 sec; used rollator while walking     Resting HR 72 bpm     Resting BP 92/50     Resting Oxygen Saturation  92 %     Exercise Oxygen Saturation  during 6 min walk 90 %     Max Ex. HR 85 bpm     Max Ex. BP 122/50     2 Minute Post BP 108/50              Oxygen Initial Assessment:   Oxygen Re-Evaluation:   Oxygen Discharge (Final Oxygen Re-Evaluation):   Initial Exercise Prescription:  Initial Exercise Prescription - 06/23/23 1100       Date of Initial Exercise RX and Referring Provider   Date 06/23/23    Referring Provider  V[place]      Oxygen   Maintain Oxygen Saturation 88% or higher      NuStep   Level 1    SPM 50    Minutes 30    METs 1  Prescription Details   Frequency (times per week) 3    Duration Progress to 30 minutes of continuous aerobic without signs/symptoms of physical distress      Intensity   THRR 40-80% of Max Heartrate 101-131    Ratings of Perceived Exertion 11-13    Perceived Dyspnea 0-4      Resistance Training   Training Prescription Yes    Weight 2    Reps 10-15             Perform Capillary Blood Glucose checks as needed.  Exercise Prescription Changes:   Exercise Prescription Changes     Row Name 07/04/23 1300 07/20/23 1300           Response to Exercise   Blood Pressure (Admit) 130/70 124/64      Blood Pressure (Exit) 118/68 108/60      Heart Rate (Admit) 74 bpm 73 bpm      Heart Rate (Exercise) 90 bpm 91 bpm      Heart Rate (Exit) 80 bpm 78 bpm      Rating of Perceived Exertion (Exercise) 12 13      Duration Continue with 30 min of aerobic exercise without signs/symptoms of physical distress. Continue with 30 min of aerobic exercise without signs/symptoms of physical distress.      Intensity THRR unchanged THRR unchanged        Progression   Progression Continue to progress workloads to maintain intensity without signs/symptoms of physical distress. Continue to progress workloads to maintain intensity without signs/symptoms of physical distress.        Resistance Training   Training Prescription Yes Yes      Weight 2 2 lbs      Reps 10-15 10-15        NuStep   Level 1 2      SPM 90 100      Minutes 30 30      METs 1.8 1.8        Oxygen   Maintain Oxygen Saturation 88% or higher 88% or higher               Exercise Comments:   Exercise Comments     Row Name 06/29/23 1152           Exercise Comments First full day of exercise!  Patient was oriented to gym and equipment including functions, settings, policies, and procedures.   Patient's individual exercise prescription and treatment plan were reviewed.  All starting workloads were established based on the results of the 6 minute walk test done at initial orientation visit.  The plan for exercise progression was also introduced and progression will be customized based on patient's performance and goals.                Exercise Goals and Review:   Exercise Goals     Row Name 06/23/23 1138             Exercise Goals   Increase Physical Activity Yes       Intervention Provide advice, education, support and counseling about physical activity/exercise needs.;Develop an individualized exercise prescription for aerobic and resistive training based on initial evaluation findings, risk stratification, comorbidities and participant's personal goals.       Expected Outcomes Short Term: Attend rehab on a regular basis to increase amount of physical activity.;Long Term: Exercising regularly at least 3-5 days a week.;Long Term: Add in home exercise to make exercise part of routine and to increase amount  of physical activity.       Increase Strength and Stamina Yes       Intervention Provide advice, education, support and counseling about physical activity/exercise needs.;Develop an individualized exercise prescription for aerobic and resistive training based on initial evaluation findings, risk stratification, comorbidities and participant's personal goals.       Expected Outcomes Short Term: Increase workloads from initial exercise prescription for resistance, speed, and METs.;Long Term: Improve cardiorespiratory fitness, muscular endurance and strength as measured by increased METs and functional capacity ( );Short Term: Perform resistance training exercises routinely during rehab and add in resistance training at home       Able to understand and use rate of perceived exertion (RPE) scale Yes       Intervention Provide education and explanation on how to use RPE scale        Expected Outcomes Short Term: Able to use RPE daily in rehab to express subjective intensity level;Long Term:  Able to use RPE to guide intensity level when exercising independently       Knowledge and understanding of Target Heart Rate Range (THRR) Yes       Intervention Provide education and explanation of THRR including how the numbers were predicted and where they are located for reference       Expected Outcomes Short Term: Able to state/look up THRR;Long Term: Able to use THRR to govern intensity when exercising independently;Short Term: Able to use daily as guideline for intensity in rehab       Able to check pulse independently Yes       Intervention Review the importance of being able to check your own pulse for safety during independent exercise;Provide education and demonstration on how to check pulse in carotid and radial arteries.       Expected Outcomes Short Term: Able to explain why pulse checking is important during independent exercise;Long Term: Able to check pulse independently and accurately       Understanding of Exercise Prescription Yes       Intervention Provide education, explanation, and written materials on patient's individual exercise prescription       Expected Outcomes Short Term: Able to explain program exercise prescription;Long Term: Able to explain home exercise prescription to exercise independently                Exercise Goals Re-Evaluation :  Exercise Goals Re-Evaluation     Row Name 06/29/23 1152 07/15/23 1105 07/20/23 1307         Exercise Goal Re-Evaluation   Exercise Goals Review Able to understand and use rate of perceived exertion (RPE) scale;Knowledge and understanding of Target Heart Rate Range (THRR);Understanding of Exercise Prescription;Able to understand and use Dyspnea scale Increase Physical Activity;Increase Strength and Stamina;Understanding of Exercise Prescription Increase Physical Activity;Increase Strength and  Stamina;Understanding of Exercise Prescription     Comments Reviewed RPE  and dyspnea scale, THR and program prescription with pt today.  Pt voiced understanding and was given a copy of goals to take home. Kailah has been doing great in rehab. She stated that she has been noticing an improvment in her strength and endurance. She is able to walk around her house wothout her walker some. She is walking around her home, also is able to stand to wash dishes and sweeps. Armonie is tolerating exercise well. She has been showering improvment in rehab and has increased her workload on the stepper by increasing her level to 2. SHe also has high SPM at 100 SPM.  Will ask next week about increasing level. Will continue to monitor and progress as able.     Expected Outcomes Short: Use RPE daily to regulate intensity.  Long: Follow program prescription in THR. Short term: continue to walk around her house     long term: continue to attend cardiac rehab Short term: increase level on NuStep in the next week   long term: continue to attend rehab               Discharge Exercise Prescription (Final Exercise Prescription Changes):  Exercise Prescription Changes - 07/20/23 1300       Response to Exercise   Blood Pressure (Admit) 124/64    Blood Pressure (Exit) 108/60    Heart Rate (Admit) 73 bpm    Heart Rate (Exercise) 91 bpm    Heart Rate (Exit) 78 bpm    Rating of Perceived Exertion (Exercise) 13    Duration Continue with 30 min of aerobic exercise without signs/symptoms of physical distress.    Intensity THRR unchanged      Progression   Progression Continue to progress workloads to maintain intensity without signs/symptoms of physical distress.      Resistance Training   Training Prescription Yes    Weight 2 lbs    Reps 10-15      NuStep   Level 2    SPM 100    Minutes 30    METs 1.8      Oxygen   Maintain Oxygen Saturation 88% or higher             Nutrition:  Target Goals:  Understanding of nutrition guidelines, daily intake of sodium 1500mg , cholesterol 200mg , calories 30% from fat and 7% or less from saturated fats, daily to have 5 or more servings of fruits and vegetables.  Biometrics:  Pre Biometrics - 06/23/23 1139       Pre Biometrics   Height 4\' 9"  (1.448 m)    Weight 146 lb 6.2 oz (66.4 kg)    Waist Circumference 43 inches    Hip Circumference 42 inches    Waist to Hip Ratio 1.02 %    BMI (Calculated) 31.67    Grip Strength 13.6 kg              Nutrition Therapy Plan and Nutrition Goals:   Nutrition Assessments:  MEDIFICTS Score Key: >=70 Need to make dietary changes  40-70 Heart Healthy Diet <= 40 Therapeutic Level Cholesterol Diet  Flowsheet Row CARDIAC REHAB PHASE II EXERCISE from 07/01/2023 in [facility] C[facility]  Picture Your Plate Total Score on Admission 37      Picture Your Plate Scores: <25 Unhealthy dietary pattern with much room for improvement. 41-50 Dietary pattern unlikely to meet recommendations for good health and room for improvement. 51-60 More healthful dietary pattern, with some room for improvement.  >60 Healthy dietary pattern, although there may be some specific behaviors that could be improved.    Nutrition Goals Re-Evaluation:  Nutrition Goals Re-Evaluation     Row Name 07/15/23 1112             Goals   Nutrition Goal Healthy eating       Comment Aileene eats three times a day with most of her meal at breakfest and dinner. She eats small snacks for lunch. She normally has an egg for breakfest with toast. She does not drink coffee due to not liking the taste. She eats a lot of chicken throught the week for  her meals and does eat veggies. She loves to eat salads as week. She does not eat much sweets.       Expected Outcome Short term: continue to pick health choise and try boild fish instead of fried.    long term: cdontinue to eat healthy                Nutrition Goals Discharge  (Final Nutrition Goals Re-Evaluation):  Nutrition Goals Re-Evaluation - 07/15/23 1112       Goals   Nutrition Goal Healthy eating    Comment Anaya eats three times a day with most of her meal at breakfest and dinner. She eats small snacks for lunch. She normally has an egg for breakfest with toast. She does not drink coffee due to not liking the taste. She eats a lot of chicken throught the week for her meals and does eat veggies. She loves to eat salads as week. She does not eat much sweets.    Expected Outcome Short term: continue to pick health choise and try boild fish instead of fried.    long term: cdontinue to eat healthy             Psychosocial: Target Goals: Acknowledge presence or absence of significant depression and/or stress, maximize coping skills, provide positive support system. Participant is able to verbalize types and ability to use techniques and skills needed for reducing stress and depression.  Initial Review & Psychosocial Screening:  Initial Psych Review & Screening - 06/23/23 1038       Initial Review   Current issues with Current Psychotropic Meds;None Identified      Family Dynamics   Good Support System? Yes   Her 2 daughter and 2 sisters.     Barriers   Psychosocial barriers to participate in program The patient should benefit from training in stress management and relaxation.;Psychosocial barriers identified (see note)      Screening Interventions   Interventions Encouraged to exercise;To provide support and resources with identified psychosocial needs;Provide feedback about the scores to participant    Expected Outcomes Short Term goal: Utilizing psychosocial counselor, staff and physician to assist with identification of specific Stressors or current issues interfering with healing process. Setting desired goal for each stressor or current issue identified.;Long Term Goal: Stressors or current issues are controlled or eliminated.;Short Term goal:  Identification and review with participant of any Quality of Life or Depression concerns found by scoring the questionnaire.;Long Term goal: The participant improves quality of Life and PHQ9 Scores as seen by post scores and/or verbalization of changes             Quality of Life Scores:  Quality of Life - 06/30/23 1051       Quality of Life   Select Quality of Life      Quality of Life Scores   Health/Function Pre 29.08 %    Socioeconomic Pre 30 %    Psych/Spiritual Pre 30 %    Family Pre 30 %    GLOBAL Pre 29.6 %            Scores of 19 and below usually indicate a poorer quality of life in these areas.  A difference of  2-3 points is a clinically meaningful difference.  A difference of 2-3 points in the total score of the Quality of Life Index has been associated with significant improvement in overall quality of life, self-image, physical symptoms, and general health in studies assessing change in quality  of life.  PHQ-9: Review Flowsheet       06/23/2023 07/27/2016 07/21/2016  Depression screen PHQ 2/9  Decreased Interest 1 0 0  Down, Depressed, Hopeless 0 0 0  PHQ - 2 Score 1 0 0  Altered sleeping 1 - -  Tired, decreased energy 1 - -  Change in appetite 2 - -  Feeling bad or failure about yourself  0 - -  Trouble concentrating 0 - -  Moving slowly or fidgety/restless 0 - -  Suicidal thoughts 0 - -  PHQ-9 Score 5 - -  Difficult doing work/chores Not difficult at all - -    Details           Interpretation of Total Score  Total Score Depression Severity:  1-4 = Minimal depression, 5-9 = Mild depression, 10-14 = Moderate depression, 15-19 = Moderately severe depression, 20-27 = Severe depression   Psychosocial Evaluation and Intervention:  Psychosocial Evaluation - 06/23/23 1038       Psychosocial Evaluation & Interventions   Interventions Stress management education;Relaxation education;Encouraged to exercise with the program and follow exercise  prescription    Comments Patient was referred to CR with TAVR at [facility]. She is accompained by her daughter today. Her PHQ-9 score was 5 due to lack of energy, not eating a lot and sleeping too much. She says she has no trouble sleeping during the night but she thinks she sleeps too much during the day because her medication make her drowsy. She says she takes on average 2 naps during the day. She takes melotonin at bedtime and Zanaflex which help her sleep. She denies any depression, anxiety or stressors in her life. She quit smoking in 2016 when she had her aortic valve replaced and used Wellbutrin to help. She remained on the medication and says she continues to take it to help her stay calm. She had to have her aortic valve replaced again 05/02/23 and she was discharged to an inpatient rehab facility. 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 She now uses a rolling walker but hopes to be able to ambulate without any assitive device after completing CR. She does live alone but her daughter stays with her during the week to be closer to her job and she has 2 sisters that stay with her over the weekend. She does not drive. Her daughter says she is going to try and work with [facility] transportation or a family member will bring her. She has no barriers to participate in the program. Her main goals for the program are to get stronger, have more energy and be able to walk without her walker.    Expected Outcomes Short Term: start the program and attend consistently. Long Term: get stronger; have more energy; walk without her walker.    Continue Psychosocial Services  Follow up required by staff             Psychosocial Re-Evaluation:  Psychosocial Re-Evaluation     Row Name 07/15/23 1109             Psychosocial Re-Evaluation   Current issues with None Identified       Comments Marquelle has been doing great in rehab. She stated that she does not have any issues with her sleep. She is  able to sleep through the night. She does have family that stays with her at her house. She is able to do things around her house herself.  Expected Outcomes Short: continue with medications to help with sleeping    long term: continue to exercise for stregnth to be independent       Interventions Stress management education;Encouraged to attend Cardiac Rehabilitation for the exercise;Relaxation education       Continue Psychosocial Services  Follow up required by staff                Psychosocial Discharge (Final Psychosocial Re-Evaluation):  Psychosocial Re-Evaluation - 07/15/23 1109       Psychosocial Re-Evaluation   Current issues with None Identified    Comments Quetzally has been doing great in rehab. She stated that she does not have any issues with her sleep. She is able to sleep through the night. She does have family that stays with her at her house. She is able to do things around her house herself.    Expected Outcomes Short: continue with medications to help with sleeping    long term: continue to exercise for stregnth to be independent    Interventions Stress management education;Encouraged to attend Cardiac Rehabilitation for the exercise;Relaxation education    Continue Psychosocial Services  Follow up required by staff             Vocational Rehabilitation: Provide vocational rehab assistance to qualifying candidates.   Vocational Rehab Evaluation & Intervention:  Vocational Rehab - 06/23/23 1010       Initial Vocational Rehab Evaluation & Intervention   Assessment shows need for Vocational Rehabilitation No      Vocational Rehab Re-Evaulation   Comments Retired.             Education: Education Goals: Education classes will be provided on a weekly basis, covering required topics. Participant will state understanding/return demonstration of topics presented.  Learning Barriers/Preferences:  Learning Barriers/Preferences - 06/23/23 1014        Learning Barriers/Preferences   Learning Barriers None    Learning Preferences Written Material;Audio;Skilled Demonstration             Education Topics: Hypertension, Hypertension Reduction -Define heart disease and high blood pressure. Discus how high blood pressure affects the body and ways to reduce high blood pressure.   Exercise and Your Heart -Discuss why it is important to exercise, the FITT principles of exercise, normal and abnormal responses to exercise, and how to exercise safely. Flowsheet Row CARDIAC REHAB PHASE II EXERCISE from 07/27/2023 in [place] CARDIAC REHABILITATION  Date 07/20/23  Educator jh  Instruction Review Code 1- Verbalizes Understanding       Angina -Discuss definition of angina, causes of angina, treatment of angina, and how to decrease risk of having angina. Flowsheet Row CARDIAC REHAB PHASE II EXERCISE from 07/27/2023 in [place] CARDIAC REHABILITATION  Date 07/13/23  Educator [facility]  Instruction Review Code 1- Verbalizes Understanding       Cardiac Medications -Review what the following cardiac medications are used for, how they affect the body, and side effects that may occur when taking the medications.  Medications include Aspirin, Beta blockers, calcium channel blockers, ACE Inhibitors, angiotensin receptor blockers, diuretics, digoxin, and antihyperlipidemics.   Congestive Heart Failure -Discuss the definition of CHF, how to live with CHF, the signs and symptoms of CHF, and how keep track of weight and sodium intake. Flowsheet Row CARDIAC REHAB PHASE II EXERCISE from 07/27/2023 in [place] CARDIAC REHABILITATION  Date 07/06/23  Educator [facility]  Instruction Review Code 2- Demonstrated Understanding       Heart Disease and Intimacy -Discus  the effect sexual activity has on the heart, how changes occur during intimacy as we age, and safety during sexual activity. Flowsheet Row CARDIAC REHAB PHASE II EXERCISE from 07/27/2023 in [place] CARDIAC REHABILITATION  Date 07/27/23  Educator [facility]  Instruction Review Code 1- Verbalizes Understanding       Smoking Cessation / COPD -Discuss different methods to quit smoking, the health benefits of quitting smoking, and the definition of COPD. Flowsheet Row CARDIAC REHAB PHASE II EXERCISE from 07/27/2023 in [place] CARDIAC REHABILITATION  Date 06/29/23  Educator [facility]  Instruction Review Code 1- Verbalizes Understanding       Nutrition I: Fats -Discuss the types of cholesterol, what cholesterol does to the heart, and how cholesterol levels can be controlled.   Nutrition II: Labels -Discuss the different components of food labels and how to read food label   Heart Parts/Heart Disease and PAD -Discuss the anatomy of the heart, the pathway of blood circulation through the heart, and these are affected by heart disease.   Stress I: Signs and Symptoms -Discuss the causes of stress, how stress may lead to anxiety and depression, and ways to limit stress.   Stress II: Relaxation -Discuss different types of relaxation techniques to limit stress.   Warning Signs of Stroke / TIA -Discuss definition of a stroke, what the signs and symptoms are of a stroke, and how to identify when someone is having stroke.   Knowledge Questionnaire Score:  Knowledge Questionnaire Score - 06/30/23 0739       Knowledge Questionnaire Score   Pre Score 22/24             Core Components/Risk Factors/Patient Goals at Admission:  Personal Goals and Risk Factors at Admission - 06/23/23 1010       Core Components/Risk Factors/Patient Goals on Admission    Weight Management Weight Maintenance    Improve shortness of breath with ADL's Yes    Intervention Provide education, individualized exercise plan and daily activity instruction to help decrease symptoms of SOB with activities of daily living.    Expected Outcomes Short Term: Improve cardiorespiratory fitness to achieve a reduction  of symptoms when performing ADLs;Long Term: Be able to perform more ADLs without symptoms or delay the onset of symptoms    Heart Failure Yes    Intervention Provide a combined exercise and nutrition program that is supplemented with education, support and counseling about heart failure. Directed toward relieving symptoms such as shortness of breath, decreased exercise tolerance, and extremity edema.    Expected Outcomes Short term: Attendance in program 2-3 days a week with increased exercise capacity. Reported lower sodium intake. Reported increased fruit and vegetable intake. Reports medication compliance.;Short term: Daily weights obtained and reported for increase. Utilizing diuretic protocols set by physician.;Long term: Adoption of self-care skills and reduction of barriers for early signs and symptoms recognition and intervention leading to self-care maintenance.    Hypertension Yes    Intervention Provide education on lifestyle modifcations including regular physical activity/exercise, weight management, moderate sodium restriction and increased consumption of fresh fruit, vegetables, and low fat dairy, alcohol moderation, and smoking cessation.;Monitor prescription use compliance.    Expected Outcomes Short Term: Continued assessment and intervention until BP is < 140/66mm HG in hypertensive participants. < 130/72mm HG in hypertensive participants with diabetes, heart failure or chronic kidney disease.;Long Term: Maintenance of blood pressure at goal levels.    Lipids Yes    Intervention Provide education and support for participant on nutrition &  aerobic/resistive exercise along with prescribed medications to achieve LDL 70mg , HDL >40mg .    Expected Outcomes Short Term: Participant states understanding of desired cholesterol values and is compliant with medications prescribed. Participant is following exercise prescription and nutrition guidelines.;Long Term: Cholesterol controlled with  medications as prescribed, with individualized exercise RX and with personalized nutrition plan. Value goals: LDL < 70mg , HDL > 40 mg.             Core Components/Risk Factors/Patient Goals Review:   Goals and Risk Factor Review     Row Name 07/15/23 1119             Core Components/Risk Factors/Patient Goals Review   Personal Goals Review Weight Management/Obesity       Review Denielle has beem happy with her weight but wouldnt mind losing a little. Jadelin is happy with her life and enjoys coming to rehab. She does not have stressors and is able to be mostly independent with her life.       Expected Outcomes Short term: do daily weight checks to monitor weight   long term: continue to do things she enjoys for happiness                Core Components/Risk Factors/Patient Goals at Discharge (Final Review):   Goals and Risk Factor Review - 07/15/23 1119       Core Components/Risk Factors/Patient Goals Review   Personal Goals Review Weight Management/Obesity    Review Trude has beem happy with her weight but wouldnt mind losing a little. Zulay is happy with her life and enjoys coming to rehab. She does not have stressors and is able to be mostly independent with her life.    Expected Outcomes Short term: do daily weight checks to monitor weight   long term: continue to do things she enjoys for happiness             ITP Comments:  ITP Comments     Row Name 06/29/23 1152 07/06/23 0819 08/03/23 0840       ITP Comments First full day of exercise!  Patient was oriented to gym and equipment including functions, settings, policies, and procedures.  Patient's individual exercise prescription and treatment plan were reviewed.  All starting workloads were established based on the results of the 6 minute walk test done at initial orientation visit.  The plan for exercise progression was also introduced and progression will be customized based on patient's performance and goals. 30 day  review completed. ITP sent to Dr. Dina Rich, Medical Director of Cardiac Rehab. Continue with ITP unless changes are made by physician.  Pt is new to program. 30 day review completed. ITP sent to Dr. Dina Rich, Medical Director of Cardiac Rehab. Continue with ITP unless changes are made by physician.              Comments: 30 day review

## 2023-08-05 ENCOUNTER — Encounter (HOSPITAL_COMMUNITY)
Admission: RE | Admit: 2023-08-05 | Discharge: 2023-08-05 | Disposition: A | Discharge status: Home / Self Care | Payer: Medicare HMO | Source: Ambulatory Visit | Attending: Internal Medicine | Admitting: Internal Medicine

## 2023-08-05 DIAGNOSIS — Z952 Presence of prosthetic heart valve: Secondary | ICD-10-CM

## 2023-08-05 NOTE — Progress Notes (Signed)
Daily Session Note  Patient Details  Name: Beth Sloan MRN: [id number] Date of Birth: Jan 02, 1946 Referring Provider:   Flowsheet Row CARDIAC REHAB PHASE II ORIENTATION from 06/23/2023 in [facility] CARDIAC REHABILITATION  Referring Provider V[place]       Encounter Date: 08/05/2023  Check In:  Session Check In - 08/05/23 1045       Check-In   Supervising physician immediately available to respond to emergencies See telemetry face sheet for immediately available MD    Location [facility]    Staff Present [name], BS, Exercise Physiologist;Tobin Cadiente BSN, RN;Other    Virtual Visit No    Medication changes reported     No    Fall or balance concerns reported    Yes    Comments Pt has a hx of falls, uses rolling walker and reports losing her balance at times    Tobacco Cessation No Change    Warm-up and Cool-down Performed on first and last piece of equipment    Resistance Training Performed Yes    VAD Patient? No    PAD/SET Patient? No      Pain Assessment   Currently in Pain? No/denies    Multiple Pain Sites No             Capillary Blood Glucose: No results found for this or any previous visit (from the past 24 hour(s)).    Social History   Tobacco Use  Smoking Status Former   Current packs/day: 0.00   Types: Cigarettes   Quit date: 06/26/2015   Years since quitting: 8.1  Smokeless Tobacco Never    Goals Met:  Independence with exercise equipment Exercise tolerated well No report of concerns or symptoms today Strength training completed today  Goals Unmet:  Not Applicable  Comments: Marland KitchenMarland KitchenPt able to follow exercise prescription today without complaint.  Will continue to monitor for progression.

## 2023-08-08 ENCOUNTER — Encounter (HOSPITAL_COMMUNITY)
Admission: RE | Admit: 2023-08-08 | Discharge: 2023-08-08 | Disposition: A | Discharge status: Home / Self Care | Payer: Medicare HMO | Source: Ambulatory Visit | Attending: Internal Medicine | Admitting: Internal Medicine

## 2023-08-08 DIAGNOSIS — Z952 Presence of prosthetic heart valve: Secondary | ICD-10-CM | POA: Diagnosis not present

## 2023-08-08 NOTE — Progress Notes (Signed)
Daily Session Note  Patient Details  Name: Kenndra Morris Gellert MRN: [id number] Date of Birth: 09/23/1946 Referring Provider:   Flowsheet Row CARDIAC REHAB PHASE II ORIENTATION from 06/23/2023 in [facility] CARDIAC REHABILITATION  Referring Provider Vavalle       Encounter Date: 08/08/2023  Check In:  Session Check In - 08/08/23 1045       Check-In   Supervising physician immediately available to respond to emergencies See telemetry face sheet for immediately available ER MD    Location [facility]    Staff Present Rodena Medin, RN, BSN;Heather Fredric Mare, BS, Exercise Physiologist    Virtual Visit No    Medication changes reported     No    Fall or balance concerns reported    Yes    Comments Pt has a hx of falls, uses rolling walker and reports losing her balance at times    Warm-up and Cool-down Performed on first and last piece of equipment    Resistance Training Performed Yes    VAD Patient? No    PAD/SET Patient? No      Pain Assessment   Currently in Pain? No/denies    Multiple Pain Sites No             Capillary Blood Glucose: No results found for this or any previous visit (from the past 24 hour(s)).    Social History   Tobacco Use  Smoking Status Former   Current packs/day: 0.00   Types: Cigarettes   Quit date: 06/26/2015   Years since quitting: 8.1  Smokeless Tobacco Never    Goals Met:  Independence with exercise equipment Exercise tolerated well No report of concerns or symptoms today Strength training completed today  Goals Unmet:  Not Applicable  Comments: Pt able to follow exercise prescription today without complaint.  Will continue to monitor for progression.

## 2023-08-10 ENCOUNTER — Encounter (HOSPITAL_COMMUNITY)
Admission: RE | Admit: 2023-08-10 | Discharge: 2023-08-10 | Disposition: A | Discharge status: Home / Self Care | Payer: Medicare HMO | Source: Ambulatory Visit | Attending: Internal Medicine | Admitting: Internal Medicine

## 2023-08-10 DIAGNOSIS — Z952 Presence of prosthetic heart valve: Secondary | ICD-10-CM

## 2023-08-10 NOTE — Progress Notes (Signed)
Daily Session Note  Patient Details  Name: Beth Sloan MRN: [id number] Date of Birth: 06-Jul-1946 Referring Provider:   Flowsheet Row CARDIAC REHAB PHASE II ORIENTATION from 06/23/2023 in [facility] CARDIAC REHABILITATION  Referring Provider V[place]alle       Encounter Date: 08/10/2023  Check In:  Session Check In - 08/10/23 1042       Check-In   Supervising physician immediately available to respond to emergencies See telemetry face sheet for immediately available MD    Location [facility]    Staff Present [name], BS, Exercise Physiologist;Jessica [place], MA, RCEP, CCRP, Dow Adolph, RN, BSN    Virtual Visit No    Medication changes reported     No    Fall or balance concerns reported    Yes    Comments Pt has a hx of falls, uses rolling walker and reports losing her balance at times    Tobacco Cessation No Change    Warm-up and Cool-down Performed on first and last piece of equipment    Resistance Training Performed Yes    VAD Patient? No    PAD/SET Patient? No      Pain Assessment   Currently in Pain? No/denies    Multiple Pain Sites No             Capillary Blood Glucose: No results found for this or any previous visit (from the past 24 hour(s)).    Social History   Tobacco Use  Smoking Status Former   Current packs/day: 0.00   Types: Cigarettes   Quit date: 06/26/2015   Years since quitting: 8.1  Smokeless Tobacco Never    Goals Met:  Independence with exercise equipment Exercise tolerated well No report of concerns or symptoms today Strength training completed today  Goals Unmet:  Not Applicable  Comments: Pt able to follow exercise prescription today without complaint.  Will continue to monitor for progression.

## 2023-08-12 ENCOUNTER — Encounter (HOSPITAL_COMMUNITY)
Admission: RE | Admit: 2023-08-12 | Discharge: 2023-08-12 | Disposition: A | Discharge status: Home / Self Care | Payer: Medicare HMO | Source: Ambulatory Visit | Attending: Internal Medicine

## 2023-08-12 DIAGNOSIS — Z952 Presence of prosthetic heart valve: Secondary | ICD-10-CM | POA: Diagnosis not present

## 2023-08-12 NOTE — Progress Notes (Signed)
Daily Session Note  Patient Details  Name: Beth Sloan MRN: [id number] Date of Birth: [birth year] Referring Provider:   Flowsheet Row CARDIAC REHAB PHASE II ORIENTATION from 06/23/2023 in [facility] CARDIAC REHABILITATION  Referring Provider V[place]alle       Encounter Date: 08/12/2023  Check In:  Session Check In - 08/12/23 1045       Check-In   Supervising physician immediately available to respond to emergencies See telemetry face sheet for immediately available MD    Location [facility]    Staff Present Avanell Shackleton BSN, RN;Jessica [place], [place], RCEP, CCRP, CCET    Virtual Visit No    Medication changes reported     No    Fall or balance concerns reported    Yes    Comments Pt has a hx of falls, uses rolling walker and reports losing her balance at times    Tobacco Cessation No Change    Warm-up and Cool-down Performed on first and last piece of equipment    Resistance Training Performed Yes    VAD Patient? No    PAD/SET Patient? No      Pain Assessment   Currently in Pain? No/denies    Multiple Pain Sites No             Capillary Blood Glucose: No results found for this or any previous visit (from the past 24 hour(s)).    Social History   Tobacco Use  Smoking Status Former   Current packs/day: 0.00   Types: Cigarettes   Quit date: 06/26/2015   Years since quitting: 8.1  Smokeless Tobacco Never    Goals Met:  Independence with exercise equipment Exercise tolerated well No report of concerns or symptoms today Strength training completed today  Goals Unmet:  Not Applicable  Comments: Marland KitchenMarland KitchenPt able to follow exercise prescription today without complaint.  Will continue to monitor for progression.

## 2023-08-15 ENCOUNTER — Ambulatory Visit (HOSPITAL_COMMUNITY)
Admission: RE | Admit: 2023-08-15 | Discharge: 2023-08-15 | Disposition: A | Discharge status: Home / Self Care | Payer: Medicare HMO | Source: Ambulatory Visit | Attending: Family Medicine | Admitting: Family Medicine

## 2023-08-15 ENCOUNTER — Encounter (HOSPITAL_COMMUNITY)
Admission: RE | Admit: 2023-08-15 | Discharge: 2023-08-15 | Disposition: A | Discharge status: Home / Self Care | Payer: Medicare HMO | Source: Ambulatory Visit | Attending: Internal Medicine

## 2023-08-15 DIAGNOSIS — Z1231 Encounter for screening mammogram for malignant neoplasm of breast: Secondary | ICD-10-CM | POA: Insufficient documentation

## 2023-08-15 DIAGNOSIS — Z952 Presence of prosthetic heart valve: Secondary | ICD-10-CM | POA: Diagnosis not present

## 2023-08-15 NOTE — Progress Notes (Signed)
Daily Session Note  Patient Details  Name: Beth Sloan MRN: [id number] Date of Birth: [birth year] Referring Provider:   Flowsheet Row CARDIAC REHAB PHASE II ORIENTATION from 06/23/2023 in [facility] CARDIAC REHABILITATION  Referring Provider Vavalle       Encounter Date: 08/15/2023  Check In:  Session Check In - 08/15/23 1100       Check-In   Supervising physician immediately available to respond to emergencies See telemetry face sheet for immediately available MD    Location [facility]    Staff Present Erskine Speed, RN;Jessica [place], MA, RCEP, CCRP, CCET;Heather [place], [place], Exercise Physiologist    Virtual Visit No    Medication changes reported     No    Fall or balance concerns reported    No    Tobacco Cessation No Change    Warm-up and Cool-down Performed on first and last piece of equipment    Resistance Training Performed Yes    VAD Patient? No    PAD/SET Patient? No      Pain Assessment   Currently in Pain? No/denies    Multiple Pain Sites No             Capillary Blood Glucose: No results found for this or any previous visit (from the past 24 hour(s)).   Exercise Prescription Changes - 08/15/23 1100       Home Exercise Plan   Plans to continue exercise at Home (comment)    Frequency Add 2 additional days to program exercise sessions.    Initial Home Exercises Provided 08/15/23             Social History   Tobacco Use  Smoking Status Former   Current packs/day: 0.00   Types: Cigarettes   Quit date: 06/26/2015   Years since quitting: 8.1  Smokeless Tobacco Never    Goals Met:  Independence with exercise equipment Exercise tolerated well No report of concerns or symptoms today Strength training completed today  Goals Unmet:  Not Applicable  Comments:Pt able to follow exercise prescription today without complaint.  Will continue to monitor for progression.

## 2023-08-15 NOTE — Progress Notes (Signed)
I have reviewed a Home Exercise Prescription with [name] . Beth Sloan is  currently exercising at home by walking around her house and doing bands. She also does some weights but uses soup cans instead of handweights.  The patient was advised to walk 3-5 days a week for 30-45 minutes.  Yanet and I discussed how to progress their exercise prescription.  The patient stated that their goals were to stay independent as long as she can and to build up her energy and stamina to helo stay independent.  The patient stated that they understand the exercise prescription.  We reviewed exercise guidelines, target heart rate during exercise, RPE Scale, weather conditions, NTG use, endpoints for exercise, warmup and cool down.  Patient is encouraged to come to me with any questions. I will continue to follow up with the patient to assist them with progression and safety.

## 2023-08-17 ENCOUNTER — Encounter (HOSPITAL_COMMUNITY)
Admission: RE | Admit: 2023-08-17 | Discharge: 2023-08-17 | Disposition: A | Discharge status: Home / Self Care | Payer: Medicare HMO | Source: Ambulatory Visit | Attending: Internal Medicine | Admitting: Internal Medicine

## 2023-08-17 DIAGNOSIS — Z952 Presence of prosthetic heart valve: Secondary | ICD-10-CM | POA: Diagnosis not present

## 2023-08-17 NOTE — Progress Notes (Signed)
Daily Session Note  Patient Details  Name: Beth Sloan MRN: [id number] Date of Birth: 05-Dec-1945 Referring Provider:   Flowsheet Row CARDIAC REHAB PHASE II ORIENTATION from 06/23/2023 in [facility] CARDIAC REHABILITATION  Referring Provider Vavalle       Encounter Date: 08/17/2023  Check In:  Session Check In - 08/17/23 1039       Check-In   Supervising physician immediately available to respond to emergencies See telemetry face sheet for immediately available MD    Location [facility]    Staff Present [name], BS, Exercise Physiologist;Jessica Juanetta [place], MA, RCEP, CCRP, CCET    Virtual Visit No    Medication changes reported     No    Fall or balance concerns reported    Yes    Comments Pt has a hx of falls, uses rolling walker and reports losing her balance at times    Tobacco Cessation No Change    Warm-up and Cool-down Performed on first and last piece of equipment    Resistance Training Performed Yes    VAD Patient? No    PAD/SET Patient? No      Pain Assessment   Currently in Pain? No/denies    Multiple Pain Sites No             Capillary Blood Glucose: No results found for this or any previous visit (from the past 24 hour(s)).    Social History   Tobacco Use  Smoking Status Former   Current packs/day: 0.00   Types: Cigarettes   Quit date: 06/26/2015   Years since quitting: 8.1  Smokeless Tobacco Never    Goals Met:  Independence with exercise equipment Exercise tolerated well No report of concerns or symptoms today Strength training completed today  Goals Unmet:  Not Applicable  Comments: Pt able to follow exercise prescription today without complaint.  Will continue to monitor for progression.

## 2023-08-19 ENCOUNTER — Encounter (HOSPITAL_COMMUNITY)
Admission: RE | Admit: 2023-08-19 | Discharge: 2023-08-19 | Disposition: A | Discharge status: Home / Self Care | Payer: Medicare HMO | Source: Ambulatory Visit | Attending: Internal Medicine

## 2023-08-19 VITALS — Ht <= 58 in | Wt 151.6 lb

## 2023-08-19 DIAGNOSIS — Z952 Presence of prosthetic heart valve: Secondary | ICD-10-CM | POA: Diagnosis not present

## 2023-08-19 NOTE — Progress Notes (Signed)
Daily Session Note  Patient Details  Name: Beth Sloan MRN: [id number] Date of Birth: [birth year] Referring Provider:   Flowsheet Row CARDIAC REHAB PHASE II ORIENTATION from 06/23/2023 in [facility] CARDIAC REHABILITATION  Referring Provider V[place]alle       Encounter Date: 08/19/2023  Check In:  Session Check In - 08/19/23 1125       Check-In   Supervising physician immediately available to respond to emergencies See telemetry face sheet for immediately available MD    Location [facility]    Staff Present Rodena Medin, RN, BSN;Hillary Troutman BSN, RN;Joey Lierman [place], MA, RCEP, CCRP, CCET    Virtual Visit No    Medication changes reported     No    Fall or balance concerns reported    No    Warm-up and Cool-down Performed on first and last piece of equipment    Resistance Training Performed Yes    VAD Patient? No    PAD/SET Patient? No      Pain Assessment   Currently in Pain? No/denies             Capillary Blood Glucose: No results found for this or any previous visit (from the past 24 hour(s)).    Social History   Tobacco Use  Smoking Status Former   Current packs/day: 0.00   Types: Cigarettes   Quit date: 06/26/2015   Years since quitting: 8.1  Smokeless Tobacco Never    Goals Met:  Independence with exercise equipment Exercise tolerated well No report of concerns or symptoms today Strength training completed today  Goals Unmet:  Not Applicable  Comments: Pt able to follow exercise prescription today without complaint.  Will continue to monitor for progression.   6 Minute Walk     Row Name 06/23/23 1135 08/19/23 1126       6 Minute Walk   Phase Initial Discharge    Distance 350 feet 1050 feet    Distance % Change -- 200 %    Distance Feet Change -- 700 ft    Walk Time 6 minutes 6 minutes    # of Rest Breaks 1 0    MPH 0.66 1.99    METS 0.37 1.77    RPE 12 12    Perceived Dyspnea  1 1    VO2 Peak 1.31 6.21     Symptoms Yes (comment) Yes (comment)    Comments needed a seated break for 1 min 20 sec; used rollator while walking legs tired at end 6/10    Resting HR 72 bpm 53 bpm    Resting BP 92/50 128/58    Resting Oxygen Saturation  92 % --    Exercise Oxygen Saturation  during 6 min walk 90 % --    Max Ex. HR 85 bpm 89 bpm    Max Ex. BP 122/50 142/74    2 Minute Post BP 108/50 --

## 2023-08-19 NOTE — Patient Instructions (Signed)
Discharge Patient Instructions  Patient Details  Name: Beth Sloan MRN: [id number] Date of Birth: 1946/05/22 Referring Provider:  No ref. provider found   Number of Visits: 36  Reason for Discharge:  Patient reached a stable level of exercise. Patient independent in their exercise. Patient has met program and personal goals.  Smoking History:  Social History   Tobacco Use  Smoking Status Former   Current packs/day: 0.00   Types: Cigarettes   Quit date: 06/26/2015   Years since quitting: 8.1  Smokeless Tobacco Never    Diagnosis:  S/P TAVR (transcatheter aortic valve replacement)  Initial Exercise Prescription:  Initial Exercise Prescription - 06/23/23 1100       Date of Initial Exercise RX and Referring Provider   Date 06/23/23    Referring Provider Vavalle      Oxygen   Maintain Oxygen Saturation 88% or higher      NuStep   Level 1    SPM 50    Minutes 30    METs 1      Prescription Details   Frequency (times per week) 3    Duration Progress to 30 minutes of continuous aerobic without signs/symptoms of physical distress      Intensity   THRR 40-80% of Max Heartrate 101-131    Ratings of Perceived Exertion 11-13    Perceived Dyspnea 0-4      Resistance Training   Training Prescription Yes    Weight 2    Reps 10-15            Functional Capacity:  6 Minute Walk     Row Name 06/23/23 1135 08/19/23 1126       6 Minute Walk   Phase Initial Discharge    Distance 350 feet 1050 feet    Distance % Change -- 200 %    Distance Feet Change -- 700 ft    Walk Time 6 minutes 6 minutes    # of Rest Breaks 1 0    MPH 0.66 1.99    METS 0.37 1.77    RPE 12 12    Perceived Dyspnea  1 1    VO2 Peak 1.31 6.21    Symptoms Yes (comment) Yes (comment)    Comments needed a seated break for 1 min 20 sec; used rollator while walking legs tired at end 6/10    Resting HR 72 bpm 53 bpm    Resting BP 92/50 128/58    Resting Oxygen Saturation  92 % --     Exercise Oxygen Saturation  during 6 min walk 90 % --    Max Ex. HR 85 bpm 89 bpm    Max Ex. BP 122/50 142/74    2 Minute Post BP 108/50 --               Nutrition & Weight - Outcomes:  Pre Biometrics - 06/23/23 1139       Pre Biometrics   Height 4\' 9"  (1.448 m)    Weight 66.4 kg    Waist Circumference 43 inches    Hip Circumference 42 inches    Waist to Hip Ratio 1.02 %    BMI (Calculated) 31.67    Grip Strength 13.6 kg             Post Biometrics - 08/19/23 1127        Post  Biometrics   Height 4\' 9"  (1.448 m)    Weight 68.8 kg    Waist Circumference  36 inches    Hip Circumference 39 inches    Waist to Hip Ratio 0.92 %    BMI (Calculated) 32.8    Grip Strength 18.5 kg    Single Leg Stand 10.2 seconds

## 2023-08-22 ENCOUNTER — Encounter (HOSPITAL_COMMUNITY)
Admission: RE | Admit: 2023-08-22 | Discharge: 2023-08-22 | Disposition: A | Discharge status: Home / Self Care | Payer: Medicare HMO | Source: Ambulatory Visit | Attending: Internal Medicine | Admitting: Internal Medicine

## 2023-08-22 DIAGNOSIS — Z952 Presence of prosthetic heart valve: Secondary | ICD-10-CM

## 2023-08-22 NOTE — Progress Notes (Signed)
Daily Session Note  Patient Details  Name: Beth Sloan MRN: [id number] Date of Birth: [birth year] Referring Provider:   Flowsheet Row CARDIAC REHAB PHASE II ORIENTATION from 06/23/2023 in [facility] CARDIAC REHABILITATION  Referring Provider [place]       Encounter Date: 08/22/2023  Check In:  Session Check In - 08/22/23 1118       Check-In   Supervising physician immediately available to respond to emergencies See telemetry face sheet for immediately available MD    Location [facility]    Staff Present Fabio Pierce, MA, RCEP, CCRP, CCET    Staff Present Cyndia Diver, RN, BSN, MA    Virtual Visit No    Medication changes reported     No    Fall or balance concerns reported    No    Warm-up and Cool-down Performed on first and last piece of equipment    Resistance Training Performed Yes    VAD Patient? No    PAD/SET Patient? No      Pain Assessment   Currently in Pain? No/denies             Capillary Blood Glucose: No results found for this or any previous visit (from the past 24 hour(s)).    Social History   Tobacco Use  Smoking Status Former   Current packs/day: 0.00   Types: Cigarettes   Quit date: 06/26/2015   Years since quitting: 8.1  Smokeless Tobacco Never    Goals Met:  Independence with exercise equipment Exercise tolerated well No report of concerns or symptoms today Strength training completed today  Goals Unmet:  Not Applicable  Comments: Pt able to follow exercise prescription today without complaint.  Will continue to monitor for progression.

## 2023-08-24 ENCOUNTER — Encounter (HOSPITAL_COMMUNITY)
Admission: RE | Admit: 2023-08-24 | Discharge: 2023-08-24 | Disposition: A | Discharge status: Home / Self Care | Payer: Medicare HMO | Source: Ambulatory Visit | Attending: Internal Medicine | Admitting: Internal Medicine

## 2023-08-24 DIAGNOSIS — Z952 Presence of prosthetic heart valve: Secondary | ICD-10-CM

## 2023-08-24 NOTE — Progress Notes (Signed)
Daily Session Note  Patient Details  Name: Beth Sloan MRN: [id number] Date of Birth: [birth year] Referring Provider:   Flowsheet Row CARDIAC REHAB PHASE II ORIENTATION from 06/23/2023 in [facility] CARDIAC REHABILITATION  Referring Provider [place]       Encounter Date: 08/24/2023  Check In:  Session Check In - 08/24/23 1030       Check-In   Supervising physician immediately available to respond to emergencies See telemetry face sheet for immediately available MD    Location [facility]    Staff Present [name], BS, Exercise Physiologist;Jessica Juanetta Gosling, MA, RCEP, CCRP, CCET    Virtual Visit No    Medication changes reported     No    Fall or balance concerns reported    No    Tobacco Cessation No Change    Warm-up and Cool-down Performed on first and last piece of equipment    Resistance Training Performed Yes    VAD Patient? No    PAD/SET Patient? No      Pain Assessment   Currently in Pain? No/denies    Multiple Pain Sites No             Capillary Blood Glucose: No results found for this or any previous visit (from the past 24 hour(s)).    Social History   Tobacco Use  Smoking Status Former   Current packs/day: 0.00   Types: Cigarettes   Quit date: 06/26/2015   Years since quitting: 8.1  Smokeless Tobacco Never    Goals Met:  Independence with exercise equipment Exercise tolerated well No report of concerns or symptoms today Strength training completed today  Goals Unmet:  Not Applicable  Comments: Pt able to follow exercise prescription today without complaint.  Will continue to monitor for progression.

## 2023-08-26 ENCOUNTER — Encounter (HOSPITAL_COMMUNITY)
Admission: RE | Admit: 2023-08-26 | Discharge: 2023-08-26 | Disposition: A | Discharge status: Home / Self Care | Payer: Medicare HMO | Source: Ambulatory Visit | Attending: Internal Medicine | Admitting: Internal Medicine

## 2023-08-26 DIAGNOSIS — Z952 Presence of prosthetic heart valve: Secondary | ICD-10-CM | POA: Diagnosis present

## 2023-08-26 NOTE — Progress Notes (Signed)
Daily Session Note  Patient Details  Name: Beth Sloan MRN: [id number] Date of Birth: 28-Feb-1946 Referring Provider:   Flowsheet Row CARDIAC REHAB PHASE II ORIENTATION from 06/23/2023 in [facility] CARDIAC REHABILITATION  Referring Provider [place]       Encounter Date: 08/26/2023  Check In:  Session Check In - 08/26/23 1100       Check-In   Supervising physician immediately available to respond to emergencies See telemetry face sheet for immediately available MD    Location [facility]    Staff Present [name], BS, Exercise Physiologist;Jessica [place], MA, RCEP, CCRP, Dow Adolph, RN, BSN    Virtual Visit No    Medication changes reported     No    Fall or balance concerns reported    No    Tobacco Cessation No Change    Warm-up and Cool-down Performed on first and last piece of equipment    Resistance Training Performed Yes    VAD Patient? No    PAD/SET Patient? No      Pain Assessment   Currently in Pain? No/denies    Multiple Pain Sites No             Capillary Blood Glucose: No results found for this or any previous visit (from the past 24 hour(s)).    Social History   Tobacco Use  Smoking Status Former   Current packs/day: 0.00   Types: Cigarettes   Quit date: 06/26/2015   Years since quitting: 8.1  Smokeless Tobacco Never    Goals Met:  Independence with exercise equipment Exercise tolerated well No report of concerns or symptoms today Strength training completed today  Goals Unmet:  Not Applicable  Comments: Pt able to follow exercise prescription today without complaint.  Will continue to monitor for progression.

## 2023-08-29 ENCOUNTER — Encounter (HOSPITAL_COMMUNITY)
Admission: RE | Admit: 2023-08-29 | Discharge: 2023-08-29 | Disposition: A | Discharge status: Home / Self Care | Payer: Medicare HMO | Source: Ambulatory Visit | Attending: Internal Medicine

## 2023-08-29 DIAGNOSIS — Z952 Presence of prosthetic heart valve: Secondary | ICD-10-CM | POA: Diagnosis not present

## 2023-08-29 NOTE — Progress Notes (Signed)
Cardiac Individual Treatment Plan  Patient Details  Name: Beth Sloan MRN: [id number] Date of Birth: 10/24/1946 Referring Provider:   Flowsheet Row CARDIAC REHAB PHASE II ORIENTATION from 06/23/2023 in [place] [facility]  Referring Provider [place]       Initial Encounter Date:  Flowsheet Row CARDIAC REHAB PHASE II ORIENTATION from 06/23/2023 in [place] CARDIAC REHABILITATION  Date 06/23/23       Visit Diagnosis: S/P TAVR (transcatheter aortic valve replacement)  Patient's Home Medications on Admission:  Current Outpatient Medications:    albuterol (VENTOLIN HFA) 108 (90 Base) MCG/ACT inhaler, Inhale 1 puff into the lungs every 6 (six) hours as needed for wheezing or shortness of breath., Disp: , Rfl:    apixaban (ELIQUIS) 5 MG TABS tablet, Take 5 mg by mouth 2 (two) times daily., Disp: , Rfl:    Ascorbic Acid (VITAMIN C) 1000 MG tablet, Take 1,000 mg by mouth., Disp: , Rfl:    aspirin EC 81 MG tablet, Take 81 mg by mouth. (Patient not taking: Reported on 06/23/2023), Disp: , Rfl:    atorvastatin (LIPITOR) 80 MG tablet, Take 40 mg by mouth. , Disp: , Rfl:    buPROPion (WELLBUTRIN SR) 150 MG 12 hr tablet, Take 150 mg by mouth daily., Disp: , Rfl:    calcium carbonate (OSCAL) 1500 (600 Ca) MG TABS tablet, Take 600 mg of elemental calcium by mouth daily with breakfast., Disp: , Rfl:    calcium carbonate (TUMS - DOSED IN MG ELEMENTAL CALCIUM) 500 MG chewable tablet, Chew 1 tablet by mouth daily., Disp: , Rfl:    Cyanocobalamin (RA VITAMIN B-12 TR) 1000 MCG TBCR, Take by mouth., Disp: , Rfl:    diclofenac Sodium (VOLTAREN) 1 % GEL, Apply 4 g topically 4 (four) times daily. As needed, Disp: , Rfl:    empagliflozin (JARDIANCE) 10 MG TABS tablet, Take 10 mg by mouth daily., Disp: , Rfl:    fluticasone (FLONASE) 50 MCG/ACT nasal spray, Place 1 spray into both nostrils daily. PRN, Disp: , Rfl:    furosemide (LASIX) 40 MG tablet, Take 40 mg by mouth daily. PRN for swelling,  Disp: , Rfl:    gabapentin (NEURONTIN) 300 MG capsule, Take 300 mg by mouth., Disp: , Rfl:    losartan (COZAAR) 50 MG tablet, Take 50 mg by mouth daily., Disp: , Rfl:    Melatonin 3 MG CAPS, Take 3 mg by mouth at bedtime. AT bedtime, Disp: , Rfl:    metoprolol tartrate (LOPRESSOR) 25 MG tablet, Take 25 mg by mouth 2 (two) times daily. , Disp: , Rfl:    Multiple Vitamin (MULTIVITAMIN) capsule, Take 1 capsule by mouth daily., Disp: , Rfl:    pantoprazole (PROTONIX) 40 MG tablet, Take 40 mg by mouth., Disp: , Rfl:    senna (SENOKOT) 8.6 MG TABS tablet, Take 1 tablet by mouth daily., Disp: , Rfl:    tizanidine (ZANAFLEX) 2 MG capsule, Take 2 mg by mouth at bedtime. Takes 1/2 tablet at bedtime, Disp: , Rfl:    traMADol (ULTRAM) 50 MG tablet, Take 50 mg by mouth. (Patient not taking: Reported on 06/23/2023), Disp: , Rfl:    umeclidinium-vilanterol (ANORO ELLIPTA) 62.5-25 MCG/ACT AEPB, Inhale 1 puff into the lungs daily., Disp: , Rfl:   Current Facility-Administered Medications:    dexamethasone (DECADRON) injection 10 mg, 10 mg, Other, Once, Beth Metz, MD   fentaNYL (SUBLIMAZE) injection 25-50 mcg, 25-50 mcg, Intravenous, Q5 min PRN, Beth Metz, MD   iopamidol (ISOVUE-M) 41 %  intrathecal injection 10 mL, 10 mL, Epidural, Once, Beth Metz, MD   lactated ringers infusion 1,000 mL, 1,000 mL, Intravenous, Once, Beth Metz, MD   midazolam (VERSED) 5 MG/5ML injection 1-2 mg, 1-2 mg, Intravenous, Q5 min PRN, Beth Metz, MD   orphenadrine (NORFLEX) injection 60 mg, 60 mg, Intramuscular, Once, Beth Metz, MD  Past Medical History: Past Medical History:  Diagnosis Date   Anxiety    Breast cancer (HCC)    Hypertension    TIA (transient ischemic attack)     Tobacco Use: Social History   Tobacco Use  Smoking Status Former   Current packs/day: 0.00   Types: Cigarettes   Quit date: 06/26/2015   Years since quitting: 8.1  Smokeless Tobacco Never     Labs: Review Flowsheet        No data to display          Capillary Blood Glucose: Lab Results  Component Value Date   GLUCAP 81 07/13/2023   GLUCAP 110 (H) 07/06/2023   GLUCAP 145 (H) 07/06/2023   GLUCAP 81 06/29/2023   GLUCAP 82 06/29/2023     Exercise Target Goals: Exercise Program Goal: Individual exercise prescription set using results from initial 6 min walk test and THRR while considering  patient's activity barriers and safety.   Exercise Prescription Goal: Starting with aerobic activity 30 plus minutes a day, 3 days per week for initial exercise prescription. Provide home exercise prescription and guidelines that participant acknowledges understanding prior to discharge.  Activity Barriers & Risk Stratification:  Activity Barriers & Cardiac Risk Stratification - 06/23/23 0836       Activity Barriers & Cardiac Risk Stratification   Activity Barriers Back Problems;Shortness of Breath;Balance Concerns;History of Falls;Assistive Device    Cardiac Risk Stratification Moderate             6 Minute Walk:  6 Minute Walk     Row Name 06/23/23 1135 08/19/23 1126       6 Minute Walk   Phase Initial Discharge    Distance 350 feet 1050 feet    Distance % Change -- 200 %    Distance Feet Change -- 700 ft    Walk Time 6 minutes 6 minutes    # of Rest Breaks 1 0    MPH 0.66 1.99    METS 0.37 1.77    RPE 12 12    Perceived Dyspnea  1 1    VO2 Peak 1.31 6.21    Symptoms Yes (comment) Yes (comment)    Comments needed a seated break for 1 min 20 sec; used rollator while walking legs tired at end 6/10    Resting HR 72 bpm 53 bpm    Resting BP 92/50 128/58    Resting Oxygen Saturation  92 % --    Exercise Oxygen Saturation  during 6 min walk 90 % --    Max Ex. HR 85 bpm 89 bpm    Max Ex. BP 122/50 142/74    2 Minute Post BP 108/50 --             Oxygen Initial Assessment:   Oxygen Re-Evaluation:   Oxygen Discharge (Final Oxygen  Re-Evaluation):   Initial Exercise Prescription:  Initial Exercise Prescription - 06/23/23 1100       Date of Initial Exercise RX and Referring Provider   Date 06/23/23    Referring Provider [place]      Oxygen   Maintain Oxygen Saturation 88% or higher  NuStep   Level 1    SPM 50    Minutes 30    METs 1      Prescription Details   Frequency (times per week) 3    Duration Progress to 30 minutes of continuous aerobic without signs/symptoms of physical distress      Intensity   THRR 40-80% of Max Heartrate 101-131    Ratings of Perceived Exertion 11-13    Perceived Dyspnea 0-4      Resistance Training   Training Prescription Yes    Weight 2    Reps 10-15             Perform Capillary Blood Glucose checks as needed.  Exercise Prescription Changes:   Exercise Prescription Changes     Row Name 07/04/23 1300 07/20/23 1300 08/03/23 1300 08/08/23 1300 08/15/23 1100     Response to Exercise   Blood Pressure (Admit) 130/70 124/64 124/62 110/60 --   Blood Pressure (Exit) 118/68 108/60 120/64 118/60 --   Heart Rate (Admit) 74 bpm 73 bpm 70 bpm 66 bpm --   Heart Rate (Exercise) 90 bpm 91 bpm 98 bpm 100 bpm --   Heart Rate (Exit) 80 bpm 78 bpm 86 bpm 94 bpm --   Rating of Perceived Exertion (Exercise) 12 13 12 13  --   Duration Continue with 30 min of aerobic exercise without signs/symptoms of physical distress. Continue with 30 min of aerobic exercise without signs/symptoms of physical distress. Continue with 30 min of aerobic exercise without signs/symptoms of physical distress. Continue with 30 min of aerobic exercise without signs/symptoms of physical distress. --   Intensity THRR unchanged THRR unchanged THRR unchanged THRR unchanged --     Progression   Progression Continue to progress workloads to maintain intensity without signs/symptoms of physical distress. Continue to progress workloads to maintain intensity without signs/symptoms of physical distress.  Continue to progress workloads to maintain intensity without signs/symptoms of physical distress. Continue to progress workloads to maintain intensity without signs/symptoms of physical distress. --     Paramedic Prescription Yes Yes Yes Yes --   Weight 2 2 lbs 2 lbs 2 lbs --   Reps 10-15 10-15 10-15 10-15 --     NuStep   Level 1 2 3 3  --   SPM 90 100 119 83 --   Minutes 30 30 30 30  --   METs 1.8 1.8 2 1.8 --     Home Exercise Plan   Plans to continue exercise at -- -- -- -- Home (comment)   Frequency -- -- -- -- Add 2 additional days to program exercise sessions.   Initial Home Exercises Provided -- -- -- -- 08/15/23     Oxygen   Maintain Oxygen Saturation 88% or higher 88% or higher 88% or higher 88% or higher --            Exercise Comments:   Exercise Comments     Row Name 06/29/23 1152 08/15/23 1114 08/29/23 1101       Exercise Comments First full day of exercise!  Patient was oriented to gym and equipment including functions, settings, policies, and procedures.  Patient's individual exercise prescription and treatment plan were reviewed.  All starting workloads were established based on the results of the 6 minute walk test done at initial orientation visit.  The plan for exercise progression was also introduced and progression will be customized based on patient's performance and goals. Went over home exercing. She is  going to look at going to the senior center when she is finsihed with the program. She does walk aroiund her house at home and does bands. Beth Sloan graduated today from  rehab with 36 sessions completed.  Details of the patient's exercise prescription and what She needs to do in order to continue the prescription and progress were discussed with patient.  Patient was given a copy of prescription and goals.  Patient verbalized understanding. Beth Sloan plans to continue to exercise by doing chair exercises and walking at home.               Exercise Goals and Review:   Exercise Goals     Row Name 06/23/23 1138             Exercise Goals   Increase Physical Activity Yes       Intervention Provide advice, education, support and counseling about physical activity/exercise needs.;Develop an individualized exercise prescription for aerobic and resistive training based on initial evaluation findings, risk stratification, comorbidities and participant's personal goals.       Expected Outcomes Short Term: Attend rehab on a regular basis to increase amount of physical activity.;Long Term: Exercising regularly at least 3-5 days a week.;Long Term: Add in home exercise to make exercise part of routine and to increase amount of physical activity.       Increase Strength and Stamina Yes       Intervention Provide advice, education, support and counseling about physical activity/exercise needs.;Develop an individualized exercise prescription for aerobic and resistive training based on initial evaluation findings, risk stratification, comorbidities and participant's personal goals.       Expected Outcomes Short Term: Increase workloads from initial exercise prescription for resistance, speed, and METs.;Long Term: Improve cardiorespiratory fitness, muscular endurance and strength as measured by increased METs and functional capacity ( );Short Term: Perform resistance training exercises routinely during rehab and add in resistance training at home       Able to understand and use rate of perceived exertion (RPE) scale Yes       Intervention Provide education and explanation on how to use RPE scale       Expected Outcomes Short Term: Able to use RPE daily in rehab to express subjective intensity level;Long Term:  Able to use RPE to guide intensity level when exercising independently       Knowledge and understanding of Target Heart Rate Range (THRR) Yes       Intervention Provide education and explanation of THRR including how the numbers  were predicted and where they are located for reference       Expected Outcomes Short Term: Able to state/look up THRR;Long Term: Able to use THRR to govern intensity when exercising independently;Short Term: Able to use daily as guideline for intensity in rehab       Able to check pulse independently Yes       Intervention Review the importance of being able to check your own pulse for safety during independent exercise;Provide education and demonstration on how to check pulse in carotid and radial arteries.       Expected Outcomes Short Term: Able to explain why pulse checking is important during independent exercise;Long Term: Able to check pulse independently and accurately       Understanding of Exercise Prescription Yes       Intervention Provide education, explanation, and written materials on patient's individual exercise prescription       Expected Outcomes Short Term: Able to explain program exercise  prescription;Long Term: Able to explain home exercise prescription to exercise independently                Exercise Goals Re-Evaluation :  Exercise Goals Re-Evaluation     Row Name 06/29/23 1152 07/15/23 1105 07/20/23 1307 08/15/23 1059       Exercise Goal Re-Evaluation   Exercise Goals Review Able to understand and use rate of perceived exertion (RPE) scale;Knowledge and understanding of Target Heart Rate Range (THRR);Understanding of Exercise Prescription;Able to understand and use Dyspnea scale Increase Physical Activity;Increase Strength and Stamina;Understanding of Exercise Prescription Increase Physical Activity;Increase Strength and Stamina;Understanding of Exercise Prescription Increase Physical Activity;Increase Strength and Stamina;Understanding of Exercise Prescription    Comments Reviewed RPE  and dyspnea scale, THR and program prescription with pt today.  Pt voiced understanding and was given a copy of goals to take home. Beth Sloan has been doing great in rehab. She stated that  she has been noticing an improvment in her strength and endurance. She is able to walk around her house wothout her walker some. She is walking around her home, also is able to stand to wash dishes and sweeps. Beth Sloan is tolerating exercise well. She has been showering improvment in rehab and has increased her workload on the stepper by increasing her level to 2. SHe also has high SPM at 100 SPM. Will ask next week about increasing level. Will continue to monitor and progress as able. Beth Sloan has been doing great with rehab. She has had more energy lately and has been able to do house work more. She has been able to sweep her house, stand and wash dishes and able to do her own laundry and put them up. She has been able to walk around the house some without her walker. Being in rehab and exercise has been able to help her stamina.    Expected Outcomes Short: Use RPE daily to regulate intensity.  Long: Follow program prescription in THR. Short term: continue to walk around her house     long term: continue to attend cardiac rehab Short term: increase level on NuStep in the next week   long term: continue to attend rehab Short term: continue to walk around the house and do household work    long term: continue to attned rehab              Discharge Exercise Prescription (Final Exercise Prescription Changes):  Exercise Prescription Changes - 08/15/23 1100       Home Exercise Plan   Plans to continue exercise at Home (comment)    Frequency Add 2 additional days to program exercise sessions.    Initial Home Exercises Provided 08/15/23             Nutrition:  Target Goals: Understanding of nutrition guidelines, daily intake of sodium 1500mg , cholesterol 200mg , calories 30% from fat and 7% or less from saturated fats, daily to have 5 or more servings of fruits and vegetables.  Biometrics:  Pre Biometrics - 06/23/23 1139       Pre Biometrics   Height 4\' 9"  (1.448 m)    Weight 66.4 kg    Waist  Circumference 43 inches    Hip Circumference 42 inches    Waist to Hip Ratio 1.02 %    BMI (Calculated) 31.67    Grip Strength 13.6 kg             Post Biometrics - 08/19/23 1127        Post  Biometrics   Height 4\' 9"  (1.448 m)    Weight 68.8 kg    Waist Circumference 36 inches    Hip Circumference 39 inches    Waist to Hip Ratio 0.92 %    BMI (Calculated) 32.8    Grip Strength 18.5 kg    Single Leg Stand 10.2 seconds             Nutrition Therapy Plan and Nutrition Goals:   Nutrition Assessments:  MEDIFICTS Score Key: >=70 Need to make dietary changes  40-70 Heart Healthy Diet <= 40 Therapeutic Level Cholesterol Diet  Flowsheet Row CARDIAC REHAB PHASE II EXERCISE from 08/26/2023 in [facility] C[facility]AC REHABILITATION  Picture Your Plate Total Score on Admission 37  Picture Your Plate Total Score on Discharge 69      Picture Your Plate Scores: <65 Unhealthy dietary pattern with much room for improvement. 41-50 Dietary pattern unlikely to meet recommendations for good health and room for improvement. 51-60 More healthful dietary pattern, with some room for improvement.  >60 Healthy dietary pattern, although there may be some specific behaviors that could be improved.    Nutrition Goals Re-Evaluation:  Nutrition Goals Re-Evaluation     Row Name 07/15/23 1112 08/15/23 1106           Goals   Nutrition Goal Healthy eating Healthy eating      Comment Beth Sloan eats three times a day with most of her meal at breakfest and dinner. She eats small snacks for lunch. She normally has an egg for breakfest with toast. She does not drink coffee due to not liking the taste. She eats a lot of chicken throught the week for her meals and does eat veggies. She loves to eat salads as week. She does not eat much sweets. Beth Sloan contiues to eat good meals. She loves to eat salads with chicken, furit and veggies in them. She normally drinks water and diet doctor pepper. She does not eat  much sweets. She does snack during the day but does not eat a big lunch so it balances out      Expected Outcome Short term: continue to pick health choise and try boild fish instead of fried.    long term: cdontinue to eat healthy Short term: try to cook fish without frying    long term: continue to eat enough and healthy choices               Nutrition Goals Discharge (Final Nutrition Goals Re-Evaluation):  Nutrition Goals Re-Evaluation - 08/15/23 1106       Goals   Nutrition Goal Healthy eating    Comment Beth Sloan contiues to eat good meals. She loves to eat salads with chicken, furit and veggies in them. She normally drinks water and diet doctor pepper. She does not eat much sweets. She does snack during the day but does not eat a big lunch so it balances out    Expected Outcome Short term: try to cook fish without frying    long term: continue to eat enough and healthy choices             Psychosocial: Target Goals: Acknowledge presence or absence of significant depression and/or stress, maximize coping skills, provide positive support system. Participant is able to verbalize types and ability to use techniques and skills needed for reducing stress and depression.  Initial Review & Psychosocial Screening:  Initial Psych Review & Screening - 06/23/23 1038       Initial Review  Current issues with Current Psychotropic Meds;None Identified      Family Dynamics   Good Support System? Yes   Her 2 daughter and 2 sisters.     Barriers   Psychosocial barriers to participate in program The patient should benefit from training in stress management and relaxation.;Psychosocial barriers identified (see note)      Screening Interventions   Interventions Encouraged to exercise;To provide support and resources with identified psychosocial needs;Provide feedback about the scores to participant    Expected Outcomes Short Term goal: Utilizing psychosocial counselor, staff and physician to  assist with identification of specific Stressors or current issues interfering with healing process. Setting desired goal for each stressor or current issue identified.;Long Term Goal: Stressors or current issues are controlled or eliminated.;Short Term goal: Identification and review with participant of any Quality of Life or Depression concerns found by scoring the questionnaire.;Long Term goal: The participant improves quality of Life and PHQ9 Scores as seen by post scores and/or verbalization of changes             Quality of Life Scores:  Quality of Life - 08/26/23 1126       Quality of Life   Select Quality of Life      Quality of Life Scores   Health/Function Pre 29.08 %    Health/Function Post 28.61 %    Health/Function % Change -1.62 %    Socioeconomic Pre 30 %    Socioeconomic Post 30 %    Socioeconomic % Change  0 %    Psych/Spiritual Pre 30 %    Psych/Spiritual Post 28.93 %    Psych/Spiritual % Change -3.57 %    Family Pre 30 %    Family Post 28.1 %    Family % Change -6.33 %    GLOBAL Pre 29.6 %    GLOBAL Post 28.94 %    GLOBAL % Change -2.23 %            Scores of 19 and below usually indicate a poorer quality of life in these areas.  A difference of  2-3 points is a clinically meaningful difference.  A difference of 2-3 points in the total score of the Quality of Life Index has been associated with significant improvement in overall quality of life, self-image, physical symptoms, and general health in studies assessing change in quality of life.  PHQ-9: Review Flowsheet       08/26/2023 06/23/2023 07/27/2016 07/21/2016  Depression screen PHQ 2/9  Decreased Interest 0 1 0 0  Down, Depressed, Hopeless 0 0 0 0  PHQ - 2 Score 0 1 0 0  Altered sleeping 0 1 - -  Tired, decreased energy 0 1 - -  Change in appetite 0 2 - -  Feeling bad or failure about yourself  0 0 - -  Trouble concentrating 0 0 - -  Moving slowly or fidgety/restless 0 0 - -  Suicidal  thoughts 0 0 - -  PHQ-9 Score 0 5 - -  Difficult doing work/chores Not difficult at all Not difficult at all - -    Details           Interpretation of Total Score  Total Score Depression Severity:  1-4 = Minimal depression, 5-9 = Mild depression, 10-14 = Moderate depression, 15-19 = Moderately severe depression, 20-27 = Severe depression   Psychosocial Evaluation and Intervention:  Psychosocial Evaluation - 06/23/23 1038       Psychosocial Evaluation & Interventions  Interventions Stress management education;Relaxation education;Encouraged to exercise with the program and follow exercise prescription    Comments Patient was referred to CR with TAVR at [facility]. She is accompained by her daughter today. Her PHQ-9 score was 5 due to lack of energy, not eating a lot and sleeping too much. She says she has no trouble sleeping during the night but she thinks she sleeps too much during the day because her medication make her drowsy. She says she takes on average 2 naps during the day. She takes melotonin at bedtime and Zanaflex which help her sleep. She denies any depression, anxiety or stressors in her life. She quit smoking in 2016 when she had her aortic valve replaced and used Wellbutrin to help. She remained on the medication and says she continues to take it to help her stay calm. She had to have her aortic valve replaced again 05/02/23 and she was discharged to an inpatient rehab facility. 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 She now uses a rolling walker but hopes to be able to ambulate without any assitive device after completing CR. She does live alone but her daughter stays with her during the week to be closer to her job and she has 2 sisters that stay with her over the weekend. She does not drive. Her daughter says she is going to try and work with [facility] transportation or a family member will bring her. She has no barriers to participate in the program. Her main  goals for the program are to get stronger, have more energy and be able to walk without her walker.    Expected Outcomes Short Term: start the program and attend consistently. Long Term: get stronger; have more energy; walk without her walker.    Continue Psychosocial Services  Follow up required by staff             Psychosocial Re-Evaluation:  Psychosocial Re-Evaluation     Row Name 07/15/23 1109 08/15/23 1104           Psychosocial Re-Evaluation   Current issues with None Identified None Identified      Comments Beth Sloan has been doing great in rehab. She stated that she does not have any issues with her sleep. She is able to sleep through the night. She does have family that stays with her at her house. She is able to do things around her house herself. Beth Sloan has been doing great. She is a happy positive person and stated shes pretty content. She has been able to keep up with her household work and sleeps well.      Expected Outcomes Short: continue with medications to help with sleeping    long term: continue to exercise for stregnth to be independent Short: continue to sleep well and do things around the house   long term: continue to be positive and indeoendent      Interventions Stress management education;Encouraged to attend Cardiac Rehabilitation for the exercise;Relaxation education Stress management education;Encouraged to attend Cardiac Rehabilitation for the exercise;Relaxation education      Continue Psychosocial Services  Follow up required by staff Follow up required by staff               Psychosocial Discharge (Final Psychosocial Re-Evaluation):  Psychosocial Re-Evaluation - 08/15/23 1104       Psychosocial Re-Evaluation   Current issues with None Identified    Comments Beth Sloan has been doing great. She is a happy positive person and  stated shes pretty content. She has been able to keep up with her household work and sleeps well.    Expected Outcomes Short: continue  to sleep well and do things around the house   long term: continue to be positive and indeoendent    Interventions Stress management education;Encouraged to attend Cardiac Rehabilitation for the exercise;Relaxation education    Continue Psychosocial Services  Follow up required by staff             Vocational Rehabilitation: Provide vocational rehab assistance to qualifying candidates.   Vocational Rehab Evaluation & Intervention:  Vocational Rehab - 06/23/23 1010       Initial Vocational Rehab Evaluation & Intervention   Assessment shows need for Vocational Rehabilitation No      Vocational Rehab Re-Evaulation   Comments Retired.             Education: Education Goals: Education classes will be provided on a weekly basis, covering required topics. Participant will state understanding/return demonstration of topics presented.  Learning Barriers/Preferences:  Learning Barriers/Preferences - 06/23/23 1014       Learning Barriers/Preferences   Learning Barriers None    Learning Preferences Written Material;Audio;Skilled Demonstration             Education Topics: Hypertension, Hypertension Reduction -Define heart disease and high blood pressure. Discus how high blood pressure affects the body and ways to reduce high blood pressure.   Exercise and Your Heart -Discuss why it is important to exercise, the FITT principles of exercise, normal and abnormal responses to exercise, and how to exercise safely. Flowsheet Row CARDIAC REHAB PHASE II EXERCISE from 08/24/2023 in [place] CARDIAC REHABILITATION  Date 07/20/23  Educator jh  Instruction Review Code 1- Verbalizes Understanding       Angina -Discuss definition of angina, causes of angina, treatment of angina, and how to decrease risk of having angina. Flowsheet Row CARDIAC REHAB PHASE II EXERCISE from 08/24/2023 in [place] CARDIAC REHABILITATION  Date 07/13/23  Educator [facility]  Instruction Review Code 1-  Verbalizes Understanding       Cardiac Medications -Review what the following cardiac medications are used for, how they affect the body, and side effects that may occur when taking the medications.  Medications include Aspirin, Beta blockers, calcium channel blockers, ACE Inhibitors, angiotensin receptor blockers, diuretics, digoxin, and antihyperlipidemics.   Congestive Heart Failure -Discuss the definition of CHF, how to live with CHF, the signs and symptoms of CHF, and how keep track of weight and sodium intake. Flowsheet Row CARDIAC REHAB PHASE II EXERCISE from 08/24/2023 in [place] CARDIAC REHABILITATION  Date 07/06/23  Educator [facility]  Instruction Review Code 2- Demonstrated Understanding       Heart Disease and Intimacy -Discus the effect sexual activity has on the heart, how changes occur during intimacy as we age, and safety during sexual activity. Flowsheet Row CARDIAC REHAB PHASE II EXERCISE from 08/24/2023 in [place] CARDIAC REHABILITATION  Date 07/27/23  Educator [facility]  Instruction Review Code 1- Verbalizes Understanding       Smoking Cessation / COPD -Discuss different methods to quit smoking, the health benefits of quitting smoking, and the definition of COPD. Flowsheet Row CARDIAC REHAB PHASE II EXERCISE from 08/24/2023 in [place] CARDIAC REHABILITATION  Date 06/29/23  Educator [facility]  Instruction Review Code 1- Verbalizes Understanding       Nutrition I: Fats -Discuss the types of cholesterol, what cholesterol does to the heart, and how cholesterol levels can be controlled. Flowsheet  Row CARDIAC REHAB PHASE II EXERCISE from 08/24/2023 in [place] CARDIAC REHABILITATION  Date 08/10/23  Educator jh  Instruction Review Code 1- Verbalizes Understanding       Nutrition II: Labels -Discuss the different components of food labels and how to read food label   Heart Parts/Heart Disease and PAD -Discuss the anatomy of the heart, the pathway of blood  circulation through the heart, and these are affected by heart disease.   Stress I: Signs and Symptoms -Discuss the causes of stress, how stress may lead to anxiety and depression, and ways to limit stress. Flowsheet Row CARDIAC REHAB PHASE II EXERCISE from 08/24/2023 in [place] CARDIAC REHABILITATION  Date 08/17/23  Educator jh  Instruction Review Code 1- Verbalizes Understanding       Stress II: Relaxation -Discuss different types of relaxation techniques to limit stress.   Warning Signs of Stroke / TIA -Discuss definition of a stroke, what the signs and symptoms are of a stroke, and how to identify when someone is having stroke.   Knowledge Questionnaire Score:  Knowledge Questionnaire Score - 08/26/23 1126       Knowledge Questionnaire Score   Pre Score 22/24    Post Score 23/24             Core Components/Risk Factors/Patient Goals at Admission:  Personal Goals and Risk Factors at Admission - 06/23/23 1010       Core Components/Risk Factors/Patient Goals on Admission    Weight Management Weight Maintenance    Improve shortness of breath with ADL's Yes    Intervention Provide education, individualized exercise plan and daily activity instruction to help decrease symptoms of SOB with activities of daily living.    Expected Outcomes Short Term: Improve cardiorespiratory fitness to achieve a reduction of symptoms when performing ADLs;Long Term: Be able to perform more ADLs without symptoms or delay the onset of symptoms    Heart Failure Yes    Intervention Provide a combined exercise and nutrition program that is supplemented with education, support and counseling about heart failure. Directed toward relieving symptoms such as shortness of breath, decreased exercise tolerance, and extremity edema.    Expected Outcomes Short term: Attendance in program 2-3 days a week with increased exercise capacity. Reported lower sodium intake. Reported increased fruit and  vegetable intake. Reports medication compliance.;Short term: Daily weights obtained and reported for increase. Utilizing diuretic protocols set by physician.;Long term: Adoption of self-care skills and reduction of barriers for early signs and symptoms recognition and intervention leading to self-care maintenance.    Hypertension Yes    Intervention Provide education on lifestyle modifcations including regular physical activity/exercise, weight management, moderate sodium restriction and increased consumption of fresh fruit, vegetables, and low fat dairy, alcohol moderation, and smoking cessation.;Monitor prescription use compliance.    Expected Outcomes Short Term: Continued assessment and intervention until BP is < 140/59mm HG in hypertensive participants. < 130/60mm HG in hypertensive participants with diabetes, heart failure or chronic kidney disease.;Long Term: Maintenance of blood pressure at goal levels.    Lipids Yes    Intervention Provide education and support for participant on nutrition & aerobic/resistive exercise along with prescribed medications to achieve LDL 70mg , HDL >40mg .    Expected Outcomes Short Term: Participant states understanding of desired cholesterol values and is compliant with medications prescribed. Participant is following exercise prescription and nutrition guidelines.;Long Term: Cholesterol controlled with medications as prescribed, with individualized exercise RX and with personalized nutrition plan. Value goals: LDL < 70mg , HDL >  40 mg.             Core Components/Risk Factors/Patient Goals Review:   Goals and Risk Factor Review     Row Name 07/15/23 1119 08/15/23 1110           Core Components/Risk Factors/Patient Goals Review   Personal Goals Review Weight Management/Obesity Weight Management/Obesity      Review Beth Sloan has beem happy with her weight but wouldnt mind losing a little. Beth Sloan is happy with her life and enjoys coming to rehab. She does not have  stressors and is able to be mostly independent with her life. Beth Sloan stated that she is happy. She isnt trying to lose weight but wouldnt mind if she lost a pound or two. She does not have stressors and stated that she is pretty happy. She is able to get around mostly on her own and is indepenfdent with her life other then driving.      Expected Outcomes Short term: do daily weight checks to monitor weight   long term: continue to do things she enjoys for happiness Short term: do daily weight checks to monitor weight   long term: continue to do things she enjoys for happiness               Core Components/Risk Factors/Patient Goals at Discharge (Final Review):   Goals and Risk Factor Review - 08/15/23 1110       Core Components/Risk Factors/Patient Goals Review   Personal Goals Review Weight Management/Obesity    Review Beth Sloan stated that she is happy. She isnt trying to lose weight but wouldnt mind if she lost a pound or two. She does not have stressors and stated that she is pretty happy. She is able to get around mostly on her own and is indepenfdent with her life other then driving.    Expected Outcomes Short term: do daily weight checks to monitor weight   long term: continue to do things she enjoys for happiness             ITP Comments:  ITP Comments     Row Name 06/29/23 1152 07/06/23 0819 08/03/23 0840 08/29/23 1101     ITP Comments First full day of exercise!  Patient was oriented to gym and equipment including functions, settings, policies, and procedures.  Patient's individual exercise prescription and treatment plan were reviewed.  All starting workloads were established based on the results of the 6 minute walk test done at initial orientation visit.  The plan for exercise progression was also introduced and progression will be customized based on patient's performance and goals. 30 day review completed. ITP sent to Dr. Dina Rich, Medical Director of Cardiac Rehab.  Continue with ITP unless changes are made by physician.  Pt is new to program. 30 day review completed. ITP sent to Dr. Dina Rich, Medical Director of Cardiac Rehab. Continue with ITP unless changes are made by physician. Mellisa graduated today from  rehab with 36 sessions completed.  Details of the patient's exercise prescription and what She needs to do in order to continue the prescription and progress were discussed with patient.  Patient was given a copy of prescription and goals.  Patient verbalized understanding. Justyn plans to continue to exercise by doing chair exercises and walking at home.             Comments: Discharge ITP

## 2023-08-29 NOTE — Progress Notes (Signed)
Beth Sloan graduated today from  rehab with 36 sessions completed.  Details of the patient's exercise prescription and what She needs to do in order to continue the prescription and progress were discussed with patient.  Patient was given a copy of prescription and goals.  Patient verbalized understanding. Beth Sloan plans to continue to exercise by doing chair exercises and walking at home.     6 Minute Walk     Row Name 06/23/23 1135 08/19/23 1126       6 Minute Walk   Phase Initial Discharge    Distance 350 feet 1050 feet    Distance % Change -- 200 %    Distance Feet Change -- 700 ft    Walk Time 6 minutes 6 minutes    # of Rest Breaks 1 0    MPH 0.66 1.99    METS 0.37 1.77    RPE 12 12    Perceived Dyspnea  1 1    VO2 Peak 1.31 6.21    Symptoms Yes (comment) Yes (comment)    Comments needed a seated break for 1 min 20 sec; used rollator while walking legs tired at end 6/10    Resting HR 72 bpm 53 bpm    Resting BP 92/50 128/58    Resting Oxygen Saturation  92 % --    Exercise Oxygen Saturation  during 6 min walk 90 % --    Max Ex. HR 85 bpm 89 bpm    Max Ex. BP 122/50 142/74    2 Minute Post BP 108/50 --

## 2023-08-29 NOTE — Progress Notes (Signed)
Daily Session Note  Patient Details  Name: Beth Sloan MRN: [id number] Date of Birth: 01-04-1946 Referring Provider:   Flowsheet Row CARDIAC REHAB PHASE II ORIENTATION from 06/23/2023 in [facility] C[facility]  Referring Provider [place]       Encounter Date: 08/29/2023  Check In:  Session Check In - 08/29/23 1100       Check-In   Supervising physician immediately available to respond to emergencies See telemetry face sheet for immediately available MD    Location [facility]    Staff Present [name], BS, Exercise Physiologist;Cliford Sequeira Hull, MA, RCEP, CCRP, Dow Adolph, RN, BSN    Virtual Visit No    Medication changes reported     No    Fall or balance concerns reported    No    Warm-up and Cool-down Performed on first and last piece of equipment    Resistance Training Performed Yes    VAD Patient? No    PAD/SET Patient? No      Pain Assessment   Currently in Pain? No/denies             Capillary Blood Glucose: No results found for this or any previous visit (from the past 24 hour(s)).    Social History   Tobacco Use  Smoking Status Former   Current packs/day: 0.00   Types: Cigarettes   Quit date: 06/26/2015   Years since quitting: 8.1  Smokeless Tobacco Never    Goals Met:  Independence with exercise equipment Exercise tolerated well No report of concerns or symptoms today Strength training completed today  Goals Unmet:  Not Applicable  Comments:  Kenia graduated today from  rehab with 36 sessions completed.  Details of the patient's exercise prescription and what She needs to do in order to continue the prescription and progress were discussed with patient.  Patient was given a copy of prescription and goals.  Patient verbalized understanding. Amalea plans to continue to exercise by doing chair exercises and walking at home.

## 2023-08-31 ENCOUNTER — Encounter (HOSPITAL_COMMUNITY): Payer: Medicare HMO

## 2023-09-02 ENCOUNTER — Encounter (HOSPITAL_COMMUNITY): Payer: Medicare HMO

## 2023-09-05 ENCOUNTER — Encounter (HOSPITAL_COMMUNITY): Payer: Medicare HMO

## 2023-09-07 ENCOUNTER — Encounter (HOSPITAL_COMMUNITY): Payer: Medicare HMO

## 2023-09-09 ENCOUNTER — Encounter (HOSPITAL_COMMUNITY): Payer: Medicare HMO

## 2023-09-12 ENCOUNTER — Encounter (HOSPITAL_COMMUNITY): Payer: Medicare HMO

## 2023-09-14 ENCOUNTER — Encounter (HOSPITAL_COMMUNITY): Payer: Medicare HMO

## 2023-09-16 ENCOUNTER — Encounter (HOSPITAL_COMMUNITY): Payer: Medicare HMO
